# Patient Record
Sex: Female | Born: 1980 | Race: White | Hispanic: No | Smoking: Current every day smoker
Health system: Southern US, Community
[De-identification: ages and names within clinical notes are randomized; demographics above are authoritative.]

## PROBLEM LIST (undated history)

## (undated) DIAGNOSIS — N201 Calculus of ureter: Secondary | ICD-10-CM

## (undated) DIAGNOSIS — R7303 Prediabetes: Secondary | ICD-10-CM

## (undated) DIAGNOSIS — E785 Hyperlipidemia, unspecified: Secondary | ICD-10-CM

## (undated) DIAGNOSIS — F17210 Nicotine dependence, cigarettes, uncomplicated: Secondary | ICD-10-CM

## (undated) DIAGNOSIS — I10 Essential (primary) hypertension: Secondary | ICD-10-CM

---

## 2016-01-31 HISTORY — PX: BREAST BIOPSY: SHX20

## 2020-04-20 ENCOUNTER — Ambulatory Visit
Admission: RE | Admit: 2020-04-20 | Discharge: 2020-04-20 | Disposition: A | Discharge status: Home / Self Care | Payer: Self-pay | Source: Ambulatory Visit | Attending: Oncology | Admitting: Oncology

## 2020-04-20 ENCOUNTER — Ambulatory Visit: Payer: Medicaid Other | Attending: Oncology

## 2020-04-20 ENCOUNTER — Other Ambulatory Visit: Payer: Self-pay

## 2020-04-20 VITALS — BP 130/79 | HR 98 | Temp 96.7°F | Ht 59.5 in | Wt 202.0 lb

## 2020-04-20 DIAGNOSIS — Z0000 Encounter for general adult medical examination without abnormal findings: Secondary | ICD-10-CM | POA: Insufficient documentation

## 2020-04-20 DIAGNOSIS — Z9189 Other specified personal risk factors, not elsewhere classified: Secondary | ICD-10-CM

## 2020-04-20 NOTE — Progress Notes (Signed)
  Subjective:     Patient ID: Aram Candela, female   DOB: November 26, 1980, 40 y.o.   MRN: [id number]  HPI   Review of Systems     Objective:   Physical Exam Chest:  Breasts:     Right: No swelling, bleeding, inverted nipple, mass, nipple discharge, skin change or tenderness.     Left: No swelling, bleeding, inverted nipple, mass, nipple discharge, skin change or tenderness.          Assessment:     40 year old patient presents for [facility] clinic visit. History of right breast biopsy. States it was a needle biopsy.  Patient has signed for release of outside films from [place].   Patient screened, and meets BCCCP eligibility.  Patient does not have insurance, Medicare or Medicaid. Instructed patient on breast self awareness using teach back method.  Clinical breast exam unremarkable. No mass or lump palpated.  Risk Assessment    Risk Scores      04/20/2020   Last edited by: Jim Like, RN   5-year risk: 2 %   Lifetime risk: 25.5 %            Plan:     Sent for bilateral screening mammogram.  Referred to High Risk clinic based on Tyrer Cuzick risk score.

## 2020-04-27 ENCOUNTER — Inpatient Hospital Stay: Payer: Medicaid Other

## 2020-04-27 ENCOUNTER — Encounter: Payer: Self-pay | Admitting: Internal Medicine

## 2020-04-27 ENCOUNTER — Inpatient Hospital Stay: Payer: Self-pay | Attending: Internal Medicine | Admitting: Internal Medicine

## 2020-04-27 VITALS — BP 117/89 | HR 103 | Temp 98.1°F | Resp 16 | Ht 59.5 in | Wt 202.0 lb

## 2020-04-27 DIAGNOSIS — Z1501 Genetic susceptibility to malignant neoplasm of breast: Secondary | ICD-10-CM

## 2020-04-27 DIAGNOSIS — Z9189 Other specified personal risk factors, not elsewhere classified: Secondary | ICD-10-CM | POA: Insufficient documentation

## 2020-04-27 DIAGNOSIS — F17210 Nicotine dependence, cigarettes, uncomplicated: Secondary | ICD-10-CM

## 2020-04-27 DIAGNOSIS — Z803 Family history of malignant neoplasm of breast: Secondary | ICD-10-CM

## 2020-04-27 NOTE — Assessment & Plan Note (Addendum)
#   HIGH RISK of BREAST CANCER-   #Had a long discussion with the patient regarding incorporation of healthy lifestyle #moderation of alcohol #abstinence of smoking #maintaining healthy BMI#exercise 150-300 minutes of moderate intensity exercise/week  #Discussed the role of tamoxifen in risk reduction of development of breast cancer.  I reviewed the data of using tamoxifen 20 mg a day for 5 years.  Studies have shown that tamoxifen reduces the risk of development of breast cancer by approximately 50%. Long discussion regarding the potential adverse events on tamoxifen including but not limited to hot flashes, mood swings, thromboembolic events strokes and also small risk of uterine cancers.    #After the lengthy discussion patient states that she she needed time to decide.  She will let us know of her decision.  # cancer screening: a] colonoscopy at 44 years b] annual PAP smear-2022 -WNL.  # Smoking: Discussed with the patient regarding the ill effects of smoking- including but not limited to cardiac lung and vascular diseases and malignancies. Counseled against smoking.   # GENETICS: Given the patient's family history of breast cancer; patient is concerned I think is reasonable to proceed with genetic counseling evaluation.   Thank you, Ms Regino Schultze allowing me to participate in the care of your pleasant patient. Please do not hesitate to contact me with questions or concerns in the interim.   # DISPOSITION: # referral to genetics Counsellor re: family hx of breast cancer # follow up TBD-Dr.B

## 2020-04-27 NOTE — Progress Notes (Signed)
[facility] CONSULT NOTE  Patient Care Team: [facility] as PCP - General (General Practice) Jim Like, RN as Registered Nurse Scarlett Presto, RN as Registered Nurse  CHIEF COMPLAINTS/PURPOSE OF CONSULTATION: High risk of breast cancer  # IBS RISK -of breast cancer at 2025% lifetime risk; 2% over the next 5 years  #Active smoker; Hx mother with breast cancer-thyroid-uterine cancer Oncology History   No history exists.     HISTORY OF PRESENTING ILLNESS:  Regina Lowe 40 y.o.  female was recently evaluated at the breast clinic/and was noted to have elevated risk of breast cancer; referred to [place] for further evaluation recommendations.  Patient states to have breast biopsy-in 2018 in [place].  She denies any malignancy. 4  Mammogram February 2022-within normal limits.   Patient's risk calculated-anywhere between-20 to 25% lifetime risk; with 2% risk over the next 5 years.  Review of Systems  Constitutional: Negative for chills, diaphoresis, fever, malaise/fatigue and weight loss.  HENT: Negative for nosebleeds and sore throat.   Eyes: Negative for double vision.  Respiratory: Negative for cough, hemoptysis, sputum production, shortness of breath and wheezing.   Cardiovascular: Negative for chest pain, palpitations, orthopnea and leg swelling.  Gastrointestinal: Negative for abdominal pain, blood in stool, constipation, diarrhea, heartburn, melena, nausea and vomiting.  Genitourinary: Negative for dysuria, frequency and urgency.  Musculoskeletal: Negative for back pain and joint pain.  Skin: Negative.  Negative for itching and rash.  Neurological: Negative for dizziness, tingling, focal weakness, weakness and headaches.  Endo/Heme/Allergies: Does not bruise/bleed easily.  Psychiatric/Behavioral: Negative for depression. The patient is not nervous/anxious and does not have insomnia.      MEDICAL HISTORY:  No past medical history on  file.  SURGICAL HISTORY: Past Surgical History:  Procedure Laterality Date  . BREAST BIOPSY Right 2018    SOCIAL HISTORY: Social History   Socioeconomic History  . Marital status: Unknown    Spouse name: Not on file  . Number of children: Not on file  . Years of education: Not on file  . Highest education level: Not on file  Occupational History  . Not on file  Tobacco Use  . Smoking status: Not on file  . Smokeless tobacco: Not on file  Substance and Sexual Activity  . Alcohol use: Not on file  . Drug use: Not on file  . Sexual activity: Not on file  Other Topics Concern  . Not on file  Social History Narrative   Moved from [place] in 2018; lives in [place] with 3 kids [sons x 2; 1 daughter]; work from [employer]; smoker ~1ppd; no alcohol.    Social Determinants of Health   Financial Resource Strain: Not on file  Food Insecurity: Not on file  Transportation Needs: Not on file  Physical Activity: Not on file  Stress: Not on file  Social Connections: Not on file  Intimate Partner Violence: Not on file    FAMILY HISTORY: Family History  Problem Relation Age of Onset  . Breast cancer Mother 54       breast, thyroid, uterine; skin cancer[place]    ALLERGIES:  has No Known Allergies.  MEDICATIONS:  Current Outpatient Medications  Medication Sig Dispense Refill  . Cholecalciferol (VITAMIN D3 PO) Take by mouth.    . Multiple Vitamins-Minerals (ONE-A-DAY WOMENS VITACRAVES PO) Take by mouth.    . Zinc Sulfate (ZINC 15 PO) Take by mouth.     No current  facility-administered medications for this visit.      Marland Kitchen  PHYSICAL EXAMINATION: ECOG PERFORMANCE STATUS: 0 - Asymptomatic  Vitals:   04/27/20 1120  BP: 117/89  Pulse: (!) 103  Resp: 16  Temp: 98.1 F (36.7 C)  SpO2: 98%   Filed Weights   04/27/20 1120  Weight: 202 lb (91.6 kg)    Physical Exam HENT:     Head: Normocephalic and atraumatic.     Mouth/Throat:      Pharynx: No oropharyngeal exudate.  Eyes:     Pupils: Pupils are equal, round, and reactive to light.  Cardiovascular:     Rate and Rhythm: Normal rate and regular rhythm.  Pulmonary:     Effort: Pulmonary effort is normal. No respiratory distress.     Breath sounds: Normal breath sounds. No wheezing.  Abdominal:     General: Bowel sounds are normal. There is no distension.     Palpations: Abdomen is soft. There is no mass.     Tenderness: There is no abdominal tenderness. There is no guarding or rebound.  Musculoskeletal:        General: No tenderness. Normal range of motion.     Cervical back: Normal range of motion and neck supple.  Skin:    General: Skin is warm.  Neurological:     Mental Status: She is alert and oriented to person, place, and time.  Psychiatric:        Mood and Affect: Affect normal.      LABORATORY DATA:  I have reviewed the data as listed No results found for: WBC, HGB, HCT, MCV, PLT No results for input(s): NA, K, CL, CO2, GLUCOSE, BUN, CREATININE, CALCIUM, GFRNONAA, GFRAA, PROT, ALBUMIN, AST, ALT, ALKPHOS, BILITOT, BILIDIR, IBILI in the last 8760 hours.  RADIOGRAPHIC STUDIES: I have personally reviewed the radiological images as listed and agreed with the findings in the report. MS DIGITAL SCREENING TOMO BILATERAL  Result Date: 04/21/2020 CLINICAL DATA:  Screening. EXAM: DIGITAL SCREENING BILATERAL MAMMOGRAM WITH TOMOSYNTHESIS AND CAD TECHNIQUE: Bilateral screening digital craniocaudal and mediolateral oblique mammograms were obtained. Bilateral screening digital breast tomosynthesis was performed. The images were evaluated with computer-aided detection. COMPARISON:  Previous exam(s). ACR Breast Density Category b: There are scattered areas of fibroglandular density. FINDINGS: There are no findings suspicious for malignancy. The images were evaluated with computer-aided detection. IMPRESSION: No mammographic evidence of malignancy. A result letter of this  screening mammogram will be mailed directly to the patient. RECOMMENDATION: Screening mammogram in one year. (Code:SM-B-01Y) BI-RADS CATEGORY  1: Negative. Electronically Signed   By: Emmaline Kluver M.D.   On: 04/21/2020 13:09    ASSESSMENT & PLAN:   At high risk for breast cancer # HIGH RISK of BREAST CANCER-   #Had a long discussion with the patient regarding incorporation of healthy lifestyle #moderation of alcohol #abstinence of smoking #maintaining healthy BMI#exercise 150-300 minutes of moderate intensity exercise/week  #Discussed the role of tamoxifen in risk reduction of development of breast cancer.  I reviewed the data of using tamoxifen 20 mg a day for 5 years.  Studies have shown that tamoxifen reduces the risk of development of breast cancer by approximately 50%. Long discussion regarding the potential adverse events on tamoxifen including but not limited to hot flashes, mood swings, thromboembolic events strokes and also small risk of uterine cancers.    #After the lengthy discussion patient states that she she needed time to decide.  She will let us know of her decision.  #  cancer screening: a] colonoscopy at 44 years b] annual PAP smear-2022 -WNL.  # Smoking: Discussed with the patient regarding the ill effects of smoking- including but not limited to cardiac lung and vascular diseases and malignancies. Counseled against smoking.   # GENETICS: Given the patient's family history of breast cancer; patient is concerned I think is reasonable to proceed with genetic counseling evaluation.   Thank you, Ms Regino Schultze allowing me to participate in the care of your pleasant patient. Please do not hesitate to contact me with questions or concerns in the interim.   # DISPOSITION: # referral to genetics Counsellor re: family hx of breast cancer # follow up TBD-Dr.B  All questions were answered. The patient knows to call the clinic with any problems, questions or concerns.     Earna Coder, MD 04/27/2020 4:20 PM

## 2020-04-28 NOTE — Progress Notes (Addendum)
Letter mailed from [facility] to notify of normal mammogram results.  Patient to return in one year for annual screening.  Seen by Dr. Donneta Romberg for high risk clinic.   Copy to [facility].

## 2020-05-13 ENCOUNTER — Other Ambulatory Visit: Payer: Medicaid Other

## 2020-05-13 ENCOUNTER — Encounter: Payer: Medicaid Other | Admitting: Licensed Clinical Social Worker

## 2020-05-27 ENCOUNTER — Inpatient Hospital Stay: Payer: Medicaid Other | Admitting: Licensed Clinical Social Worker

## 2020-05-27 ENCOUNTER — Inpatient Hospital Stay: Payer: Medicaid Other

## 2020-06-03 ENCOUNTER — Inpatient Hospital Stay: Payer: Medicaid Other | Admitting: Licensed Clinical Social Worker

## 2020-06-03 ENCOUNTER — Inpatient Hospital Stay: Payer: Medicaid Other

## 2020-06-16 DIAGNOSIS — E785 Hyperlipidemia, unspecified: Secondary | ICD-10-CM | POA: Diagnosis not present

## 2020-06-16 DIAGNOSIS — E669 Obesity, unspecified: Secondary | ICD-10-CM | POA: Diagnosis not present

## 2020-06-16 DIAGNOSIS — R0789 Other chest pain: Secondary | ICD-10-CM | POA: Diagnosis not present

## 2020-12-27 DIAGNOSIS — M542 Cervicalgia: Secondary | ICD-10-CM | POA: Diagnosis not present

## 2021-04-08 DIAGNOSIS — F330 Major depressive disorder, recurrent, mild: Secondary | ICD-10-CM | POA: Diagnosis not present

## 2021-04-08 DIAGNOSIS — R000 Tachycardia, unspecified: Secondary | ICD-10-CM | POA: Diagnosis not present

## 2021-04-08 DIAGNOSIS — Z139 Encounter for screening, unspecified: Secondary | ICD-10-CM | POA: Diagnosis not present

## 2021-04-08 DIAGNOSIS — Z1389 Encounter for screening for other disorder: Secondary | ICD-10-CM | POA: Diagnosis not present

## 2021-04-08 DIAGNOSIS — E781 Pure hyperglyceridemia: Secondary | ICD-10-CM | POA: Diagnosis not present

## 2021-04-08 DIAGNOSIS — Z0000 Encounter for general adult medical examination without abnormal findings: Secondary | ICD-10-CM | POA: Diagnosis not present

## 2021-04-08 DIAGNOSIS — R7309 Other abnormal glucose: Secondary | ICD-10-CM | POA: Diagnosis not present

## 2021-04-20 DIAGNOSIS — Z1389 Encounter for screening for other disorder: Secondary | ICD-10-CM | POA: Diagnosis not present

## 2021-04-20 DIAGNOSIS — R7309 Other abnormal glucose: Secondary | ICD-10-CM | POA: Diagnosis not present

## 2021-05-05 DIAGNOSIS — F330 Major depressive disorder, recurrent, mild: Secondary | ICD-10-CM | POA: Diagnosis not present

## 2021-06-02 ENCOUNTER — Other Ambulatory Visit: Payer: Self-pay | Admitting: Family Medicine

## 2021-06-02 DIAGNOSIS — Z1231 Encounter for screening mammogram for malignant neoplasm of breast: Secondary | ICD-10-CM

## 2021-06-07 DIAGNOSIS — R7309 Other abnormal glucose: Secondary | ICD-10-CM | POA: Diagnosis not present

## 2021-06-07 DIAGNOSIS — Z1389 Encounter for screening for other disorder: Secondary | ICD-10-CM | POA: Diagnosis not present

## 2021-06-28 DIAGNOSIS — F330 Major depressive disorder, recurrent, mild: Secondary | ICD-10-CM | POA: Diagnosis not present

## 2021-07-01 ENCOUNTER — Ambulatory Visit
Admission: RE | Admit: 2021-07-01 | Discharge: 2021-07-01 | Disposition: A | Discharge status: Home / Self Care | Payer: Medicaid Other | Source: Ambulatory Visit | Attending: Family Medicine | Admitting: Family Medicine

## 2021-07-01 DIAGNOSIS — Z1231 Encounter for screening mammogram for malignant neoplasm of breast: Secondary | ICD-10-CM | POA: Insufficient documentation

## 2021-07-26 DIAGNOSIS — R7309 Other abnormal glucose: Secondary | ICD-10-CM | POA: Diagnosis not present

## 2021-07-26 DIAGNOSIS — Z1389 Encounter for screening for other disorder: Secondary | ICD-10-CM | POA: Diagnosis not present

## 2021-08-09 DIAGNOSIS — E781 Pure hyperglyceridemia: Secondary | ICD-10-CM | POA: Diagnosis not present

## 2021-08-09 DIAGNOSIS — R079 Chest pain, unspecified: Secondary | ICD-10-CM | POA: Diagnosis not present

## 2021-08-09 DIAGNOSIS — L989 Disorder of the skin and subcutaneous tissue, unspecified: Secondary | ICD-10-CM | POA: Diagnosis not present

## 2021-08-09 DIAGNOSIS — Z1389 Encounter for screening for other disorder: Secondary | ICD-10-CM | POA: Diagnosis not present

## 2021-09-06 DIAGNOSIS — Z1389 Encounter for screening for other disorder: Secondary | ICD-10-CM | POA: Diagnosis not present

## 2021-09-06 DIAGNOSIS — R7309 Other abnormal glucose: Secondary | ICD-10-CM | POA: Diagnosis not present

## 2021-10-27 DIAGNOSIS — G44209 Tension-type headache, unspecified, not intractable: Secondary | ICD-10-CM | POA: Diagnosis not present

## 2021-10-27 DIAGNOSIS — Z1389 Encounter for screening for other disorder: Secondary | ICD-10-CM | POA: Diagnosis not present

## 2021-10-27 DIAGNOSIS — E781 Pure hyperglyceridemia: Secondary | ICD-10-CM | POA: Diagnosis not present

## 2021-10-27 DIAGNOSIS — M79605 Pain in left leg: Secondary | ICD-10-CM | POA: Diagnosis not present

## 2021-10-27 DIAGNOSIS — F330 Major depressive disorder, recurrent, mild: Secondary | ICD-10-CM | POA: Diagnosis not present

## 2021-10-31 ENCOUNTER — Other Ambulatory Visit: Payer: Self-pay | Admitting: Family Medicine

## 2021-10-31 DIAGNOSIS — M79605 Pain in left leg: Secondary | ICD-10-CM

## 2021-11-10 ENCOUNTER — Ambulatory Visit
Admission: RE | Admit: 2021-11-10 | Discharge: 2021-11-10 | Disposition: A | Discharge status: Home / Self Care | Payer: Medicaid Other | Source: Ambulatory Visit | Attending: Family Medicine | Admitting: Family Medicine

## 2021-11-10 DIAGNOSIS — M79662 Pain in left lower leg: Secondary | ICD-10-CM | POA: Diagnosis not present

## 2021-11-10 DIAGNOSIS — M79605 Pain in left leg: Secondary | ICD-10-CM | POA: Insufficient documentation

## 2021-11-29 DIAGNOSIS — Z1389 Encounter for screening for other disorder: Secondary | ICD-10-CM | POA: Diagnosis not present

## 2021-11-29 DIAGNOSIS — Z112 Encounter for screening for other bacterial diseases: Secondary | ICD-10-CM | POA: Diagnosis not present

## 2021-11-29 DIAGNOSIS — J020 Streptococcal pharyngitis: Secondary | ICD-10-CM | POA: Diagnosis not present

## 2022-01-30 DIAGNOSIS — Z419 Encounter for procedure for purposes other than remedying health state, unspecified: Secondary | ICD-10-CM | POA: Diagnosis not present

## 2022-03-02 DIAGNOSIS — Z419 Encounter for procedure for purposes other than remedying health state, unspecified: Secondary | ICD-10-CM | POA: Diagnosis not present

## 2022-03-08 DIAGNOSIS — H5213 Myopia, bilateral: Secondary | ICD-10-CM | POA: Diagnosis not present

## 2022-03-31 DIAGNOSIS — Z419 Encounter for procedure for purposes other than remedying health state, unspecified: Secondary | ICD-10-CM | POA: Diagnosis not present

## 2022-05-01 DIAGNOSIS — Z419 Encounter for procedure for purposes other than remedying health state, unspecified: Secondary | ICD-10-CM | POA: Diagnosis not present

## 2022-05-10 DIAGNOSIS — F4321 Adjustment disorder with depressed mood: Secondary | ICD-10-CM | POA: Diagnosis not present

## 2022-05-24 DIAGNOSIS — F4321 Adjustment disorder with depressed mood: Secondary | ICD-10-CM | POA: Diagnosis not present

## 2022-05-31 DIAGNOSIS — Z419 Encounter for procedure for purposes other than remedying health state, unspecified: Secondary | ICD-10-CM | POA: Diagnosis not present

## 2022-06-21 DIAGNOSIS — F4321 Adjustment disorder with depressed mood: Secondary | ICD-10-CM | POA: Diagnosis not present

## 2022-07-01 DIAGNOSIS — Z419 Encounter for procedure for purposes other than remedying health state, unspecified: Secondary | ICD-10-CM | POA: Diagnosis not present

## 2022-07-05 DIAGNOSIS — F4321 Adjustment disorder with depressed mood: Secondary | ICD-10-CM | POA: Diagnosis not present

## 2022-07-19 DIAGNOSIS — F4321 Adjustment disorder with depressed mood: Secondary | ICD-10-CM | POA: Diagnosis not present

## 2022-07-31 DIAGNOSIS — Z419 Encounter for procedure for purposes other than remedying health state, unspecified: Secondary | ICD-10-CM | POA: Diagnosis not present

## 2022-08-16 DIAGNOSIS — F4321 Adjustment disorder with depressed mood: Secondary | ICD-10-CM | POA: Diagnosis not present

## 2022-08-31 DIAGNOSIS — Z419 Encounter for procedure for purposes other than remedying health state, unspecified: Secondary | ICD-10-CM | POA: Diagnosis not present

## 2022-09-13 DIAGNOSIS — F4321 Adjustment disorder with depressed mood: Secondary | ICD-10-CM | POA: Diagnosis not present

## 2022-09-26 DIAGNOSIS — Z1389 Encounter for screening for other disorder: Secondary | ICD-10-CM | POA: Diagnosis not present

## 2022-09-26 DIAGNOSIS — R7309 Other abnormal glucose: Secondary | ICD-10-CM | POA: Diagnosis not present

## 2022-09-26 DIAGNOSIS — E781 Pure hyperglyceridemia: Secondary | ICD-10-CM | POA: Diagnosis not present

## 2022-09-26 DIAGNOSIS — M25521 Pain in right elbow: Secondary | ICD-10-CM | POA: Diagnosis not present

## 2022-09-26 DIAGNOSIS — Z1331 Encounter for screening for depression: Secondary | ICD-10-CM | POA: Diagnosis not present

## 2022-09-29 ENCOUNTER — Other Ambulatory Visit: Payer: Self-pay | Admitting: Family Medicine

## 2022-09-29 DIAGNOSIS — Z1231 Encounter for screening mammogram for malignant neoplasm of breast: Secondary | ICD-10-CM

## 2022-10-10 ENCOUNTER — Ambulatory Visit
Admission: RE | Admit: 2022-10-10 | Discharge: 2022-10-10 | Disposition: A | Discharge status: Home / Self Care | Payer: Medicaid Other | Source: Ambulatory Visit | Attending: Family Medicine | Admitting: Family Medicine

## 2022-10-10 DIAGNOSIS — Z1231 Encounter for screening mammogram for malignant neoplasm of breast: Secondary | ICD-10-CM | POA: Insufficient documentation

## 2022-11-25 ENCOUNTER — Inpatient Hospital Stay
Admission: RE | Admit: 2022-11-25 | Discharge: 2022-11-25 | Discharge status: Home / Self Care | Payer: Medicaid Other | Source: Ambulatory Visit

## 2022-11-25 VITALS — BP 125/89 | HR 102 | Temp 99.4°F | Resp 18

## 2022-11-25 DIAGNOSIS — H9201 Otalgia, right ear: Secondary | ICD-10-CM

## 2022-11-25 DIAGNOSIS — K0889 Other specified disorders of teeth and supporting structures: Secondary | ICD-10-CM | POA: Diagnosis not present

## 2022-11-25 MED ORDER — AMOXICILLIN-POT CLAVULANATE 875-125 MG PO TABS: 1.0000 | ORAL_TABLET | Freq: Two times a day (BID) | ORAL | 0 refills | Status: DC

## 2022-11-25 NOTE — Discharge Instructions (Addendum)
Exam there are no overt signs of infection to your ears throat, very mild erythema within the ear canal however as you endorsed discomfort within the teeth will move forward with treatment as such  Take augmentin twice daily for 7 days, you should begin to see improvement after 48 hours of medication use and then it should progressively get better  May attempt use of salt water gargles , throat lozenges, warm liquids for additional comfort  You may use Tylenol or ibuprofen for management of discomfort  May hold warm compresses to the ear for additional comfort  Please not attempted any ear cleaning or object or fluid placement into the ear canal to prevent further irritation  Lease follow-up with dentist for further evaluation and management of your teeth, local office listed on front page

## 2022-11-25 NOTE — ED Triage Notes (Signed)
Pain in right ear that goes down her neck and throat, that started 2 days ago. Taking ibuprofen with little relief.

## 2022-11-25 NOTE — ED Provider Notes (Signed)
Regina Lowe    CSN: [id number] Arrival date & time: 11/25/22  1312      History   Chief Complaint Chief Complaint  Patient presents with   Sore Throat    Right ear pain, neck pain - Entered by patient    HPI Regina Lowe is a 42 y.o. female.   Patient presents for evaluation of right-sided ear pain radiating to the right side of the neck present for 2 days.  Its pain can be felt when turning head to the left and the right.  Has been experiencing mild T sensitivity, exacerbated by cold.  Is in need of dental work.  Has been able to tolerate food and liquids.  No known sick contacts prior.  Denies presence of fever, congestion, cough.   History reviewed. No pertinent past medical history.  Patient Active Problem List   Diagnosis Date Noted   At high risk for breast cancer 04/27/2020    Past Surgical History:  Procedure Laterality Date   BREAST BIOPSY Right 2018    OB History   No obstetric history on file.      Home Medications    Prior to Admission medications   Medication Sig Start Date End Date Taking? Authorizing Provider  amoxicillin-clavulanate (AUGMENTIN) 875-125 MG tablet Take 1 tablet by mouth every 12 (twelve) hours. 11/25/22  Yes Rinoa Garramone R, NP  meloxicam (MOBIC) 15 MG tablet Take 1 tablet every day by oral route. 11/15/22  Yes [provider]  Cholecalciferol (VITAMIN D3 PO) Take by mouth.    [provider]  losartan (COZAAR) 25 MG tablet     [provider]  Multiple Vitamins-Minerals (ONE-A-DAY WOMENS VITACRAVES PO) Take by mouth.    [provider]  WEGOVY 0.25 MG/0.5ML SOAJ     [provider]  Zinc Sulfate (ZINC 15 PO) Take by mouth.    [provider]    Family History Family History  Problem Relation Age of Onset   Breast cancer Mother 84       breast, thyroid, uterine; skin cancer[place]   Breast cancer Maternal Aunt     Social History Social  History   Tobacco Use   Smoking status: Every Day    Current packs/day: 0.50    Average packs/day: 0.5 packs/day for 34.8 years (17.4 ttl pk-yrs)    Types: Cigarettes    Start date: 1990   Smokeless tobacco: Never  Substance Use Topics   Alcohol use: Never   Drug use: Yes    Types: Marijuana     Allergies   Patient has no known allergies.   Review of Systems Review of Systems   Physical Exam Triage Vital Signs ED Triage Vitals  Encounter Vitals Group     BP 11/25/22 1323 125/89     Systolic BP Percentile --      Diastolic BP Percentile --      Pulse Rate 11/25/22 1323 (!) 102     Resp 11/25/22 1323 18     Temp 11/25/22 1323 99.4 F (37.4 C)     Temp Source 11/25/22 1323 Oral     SpO2 11/25/22 1323 95 %     Weight --      Height --      Head Circumference --      Peak Flow --      Pain Score 11/25/22 1324 5     Pain Loc --      Pain  Education --      Exclude from [employer] Chart --    No data found.  Updated Vital Signs BP 125/89 (BP Location: Left Arm)   Pulse (!) 102   Temp 99.4 F (37.4 C) (Oral)   Resp 18   LMP 11/10/2022 (Approximate)   SpO2 95%   Visual Acuity Right Eye Distance:   Left Eye Distance:   Bilateral Distance:    Right Eye Near:   Left Eye Near:    Bilateral Near:     Physical Exam Constitutional:      Appearance: She is well-developed.  HENT:     Head: Normocephalic.     Left Ear: Tympanic membrane, ear canal and external ear normal.     Ears:     Comments: Scant erythema to the right ear canal, no swelling, drainage, no abnormality to the tympanic membrane or external ear    Nose: No congestion or rhinorrhea.     Mouth/Throat:     Pharynx: No oropharyngeal exudate or posterior oropharyngeal erythema.     Comments:  No significant dental decay or gingival swelling, no dental abscess noted Musculoskeletal:     Cervical back: Normal range of motion.  Lymphadenopathy:     Cervical: Cervical adenopathy present.   Neurological:     Mental Status: She is alert.      UC Treatments / Results  Labs (all labs ordered are listed, but only abnormal results are displayed) Labs Reviewed - No data to display  EKG   Radiology No results found.  Procedures Procedures (including critical care time)  Medications Ordered in UC Medications - No data to display  Initial Impression / Assessment and Plan / UC Course  I have reviewed the triage vital signs and the nursing notes.  Pertinent labs & imaging results that were available during my care of the patient were reviewed by me and considered in my medical decision making (see chart for details).  Otalgia right ear, dental pain  No overt signs of infection to the ear or throat, discussed with patient, deferring strep testing at this time, denies presence of fever, believes symptoms are coming from her teeth will move forward with prophylactic treatment, Augmentin prescribed, recommended supportive care and advised follow-up if symptoms continue to persist or worsening, given walker referral to local dentist office for further management Final Clinical Impressions(s) / UC Diagnoses   Final diagnoses:  Otalgia, right ear  Pain, dental     Discharge Instructions      Exam there are no overt signs of infection to your ears throat, very mild erythema within the ear canal however as you endorsed discomfort within the teeth will move forward with treatment as such  Take augmentin twice daily for 7 days, you should begin to see improvement after 48 hours of medication use and then it should progressively get better  May attempt use of salt water gargles , throat lozenges, warm liquids for additional comfort  You may use Tylenol or ibuprofen for management of discomfort  May hold warm compresses to the ear for additional comfort  Please not attempted any ear cleaning or object or fluid placement into the ear canal to prevent further  irritation  Lease follow-up with dentist for further evaluation and management of your teeth, local office listed on front page    ED Prescriptions     Medication Sig Dispense Auth. Provider   amoxicillin-clavulanate (AUGMENTIN) 875-125 MG tablet Take 1 tablet by mouth every 12 (twelve) hours.  14 tablet Arriyah Madej, Elita Boone, NP      PDMP not reviewed this encounter.   Valinda Hoar, NP 11/25/22 1352

## 2023-10-29 ENCOUNTER — Emergency Department (HOSPITAL_BASED_OUTPATIENT_CLINIC_OR_DEPARTMENT_OTHER)

## 2023-10-29 ENCOUNTER — Emergency Department (HOSPITAL_BASED_OUTPATIENT_CLINIC_OR_DEPARTMENT_OTHER): Admitting: Radiology

## 2023-10-29 ENCOUNTER — Emergency Department (HOSPITAL_BASED_OUTPATIENT_CLINIC_OR_DEPARTMENT_OTHER): Admission: EM | Admit: 2023-10-29 | Discharge: 2023-10-30 | Disposition: A

## 2023-10-29 ENCOUNTER — Encounter (HOSPITAL_BASED_OUTPATIENT_CLINIC_OR_DEPARTMENT_OTHER): Payer: Self-pay | Admitting: Emergency Medicine

## 2023-10-29 ENCOUNTER — Other Ambulatory Visit: Payer: Self-pay

## 2023-10-29 DIAGNOSIS — R0602 Shortness of breath: Secondary | ICD-10-CM | POA: Insufficient documentation

## 2023-10-29 DIAGNOSIS — N12 Tubulo-interstitial nephritis, not specified as acute or chronic: Secondary | ICD-10-CM | POA: Insufficient documentation

## 2023-10-29 DIAGNOSIS — R079 Chest pain, unspecified: Secondary | ICD-10-CM | POA: Diagnosis not present

## 2023-10-29 DIAGNOSIS — R112 Nausea with vomiting, unspecified: Secondary | ICD-10-CM | POA: Diagnosis present

## 2023-10-29 LAB — COMPREHENSIVE METABOLIC PANEL WITH GFR
ALT: 9 U/L (ref 0–44)
AST: 15 U/L (ref 15–41)
Albumin: 4.1 g/dL (ref 3.5–5.0)
Alkaline Phosphatase: 78 U/L (ref 38–126)
Anion gap: 17 — ABNORMAL HIGH (ref 5–15)
BUN: 11 mg/dL (ref 6–20)
CO2: 20 mmol/L — ABNORMAL LOW (ref 22–32)
Calcium: 9.5 mg/dL (ref 8.9–10.3)
Chloride: 100 mmol/L (ref 98–111)
Creatinine, Ser: 0.76 mg/dL (ref 0.44–1.00)
GFR, Estimated: >60 mL/min (ref >60)
Glucose, Bld: 115 mg/dL — ABNORMAL HIGH (ref 70–99)
Potassium: 2.9 mmol/L — ABNORMAL LOW (ref 3.5–5.1)
Sodium: 137 mmol/L (ref 135–145)
Total Bilirubin: 0.6 mg/dL (ref 0.0–1.2)
Total Protein: 7 g/dL (ref 6.5–8.1)

## 2023-10-29 LAB — CBC
HCT: 40.7 % (ref 36.0–46.0)
Hemoglobin: 14 g/dL (ref 12.0–15.0)
MCH: 29.5 pg (ref 26.0–34.0)
MCHC: 34.4 g/dL (ref 30.0–36.0)
MCV: 85.7 fL (ref 80.0–100.0)
Platelets: 325 K/uL (ref 150–400)
RBC: 4.75 MIL/uL (ref 3.87–5.11)
RDW: 13.8 % (ref 11.5–15.5)
WBC: 18 K/uL — ABNORMAL HIGH (ref 4.0–10.5)
nRBC: 0 % (ref 0.0–0.2)

## 2023-10-29 LAB — URINALYSIS, MICROSCOPIC (REFLEX): RBC / HPF: >50 RBC/hpf (ref 0–5)

## 2023-10-29 LAB — URINALYSIS, ROUTINE W REFLEX MICROSCOPIC

## 2023-10-29 LAB — TROPONIN T, HIGH SENSITIVITY: Troponin T High Sensitivity: <15 ng/L (ref 0–19)

## 2023-10-29 LAB — HCG, SERUM, QUALITATIVE: Preg, Serum: NEGATIVE

## 2023-10-29 LAB — LIPASE, BLOOD: Lipase: 18 U/L (ref 11–51)

## 2023-10-29 MED ORDER — SODIUM CHLORIDE 0.9 % IV BOLUS
1000.0000 mL | Freq: Once | INTRAVENOUS | Status: AC
Start: 2023-10-29 — End: 2023-10-29
  Administered 2023-10-29: 1000 mL via INTRAVENOUS

## 2023-10-29 MED ORDER — CEFTRIAXONE SODIUM 1 G IJ SOLR
1.0000 g | Freq: Once | INTRAMUSCULAR | Status: AC
  Administered 2023-10-29: 1 g via INTRAVENOUS
  Filled 2023-10-29: qty 10

## 2023-10-29 MED ORDER — HYDROMORPHONE HCL 1 MG/ML IJ SOLN
0.5000 mg | Freq: Once | INTRAMUSCULAR | Status: AC
  Administered 2023-10-29: 0.5 mg via INTRAVENOUS
  Filled 2023-10-29: qty 1

## 2023-10-29 MED ORDER — POTASSIUM CHLORIDE CRYS ER 20 MEQ PO TBCR
40.0000 meq | EXTENDED_RELEASE_TABLET | Freq: Once | ORAL | Status: AC
  Administered 2023-10-29: 40 meq via ORAL
  Filled 2023-10-29: qty 2

## 2023-10-29 MED ORDER — ONDANSETRON HCL 4 MG/2ML IJ SOLN
4.0000 mg | Freq: Once | INTRAMUSCULAR | Status: AC
  Administered 2023-10-29: 4 mg via INTRAVENOUS
  Filled 2023-10-29: qty 2

## 2023-10-29 MED ORDER — MORPHINE SULFATE (PF) 2 MG/ML IV SOLN
2.0000 mg | Freq: Once | INTRAVENOUS | Status: AC
  Administered 2023-10-29: 2 mg via INTRAVENOUS
  Filled 2023-10-29: qty 1

## 2023-10-29 MED ORDER — ONDANSETRON 4 MG PO TBDP: 4.0000 mg | ORAL_TABLET | Freq: Once | ORAL | Status: DC | PRN

## 2023-10-29 MED ORDER — KETOROLAC TROMETHAMINE 15 MG/ML IJ SOLN
15.0000 mg | Freq: Once | INTRAMUSCULAR | Status: AC
  Administered 2023-10-29: 15 mg via INTRAVENOUS
  Filled 2023-10-29: qty 1

## 2023-10-29 NOTE — ED Notes (Signed)
 Pt back in room from xray

## 2023-10-29 NOTE — ED Triage Notes (Signed)
 Pt c/o emesis since Saturday which had stopped yesterday, but started back up again today. Endorses chest pain and shob. Takes wegovy

## 2023-10-29 NOTE — ED Notes (Signed)
 Ambulated with nurse to restroom to produce urine specimen.

## 2023-10-30 MED ORDER — HYDROCODONE-ACETAMINOPHEN 5-325 MG PO TABS: 1.0000 | ORAL_TABLET | ORAL | 0 refills | Status: AC | PRN

## 2023-10-30 MED ORDER — PROMETHAZINE HCL 25 MG RE SUPP: 25.0000 mg | Freq: Four times a day (QID) | RECTAL | 0 refills | Status: DC | PRN

## 2023-10-30 MED ORDER — ONDANSETRON 4 MG PO TBDP: 4.0000 mg | ORAL_TABLET | Freq: Three times a day (TID) | ORAL | 0 refills | Status: DC | PRN

## 2023-10-30 MED ORDER — CIPROFLOXACIN HCL 500 MG PO TABS: 500.0000 mg | ORAL_TABLET | Freq: Two times a day (BID) | ORAL | 0 refills | Status: DC

## 2023-10-30 NOTE — ED Provider Notes (Signed)
 [place] [facility]ERGENCY DEPARTMENT AT [facility] Provider Note   CSN: [id number] Arrival date & time: 10/29/23  1933     Patient presents with: Emesis and Chest Pain   Regina Lowe is a 43 y.o. female.   This is a 43 year old female presenting emergency department with concern for uncontrolled nausea vomiting.  Reports symptoms started on Saturday somewhat started yesterday, but then returned again.  Left-sided flank pain rating to abdomen.  Also reporting some chest pain and shortness of breath.  No prior surgeries.  Denies fevers and chills   Emesis Chest Pain Associated symptoms: vomiting        Prior to Admission medications   Medication Sig Start Date End Date Taking? Authorizing Provider  amoxicillin -clavulanate (AUGMENTIN ) 875-125 MG tablet Take 1 tablet by mouth every 12 (twelve) hours. 11/25/22   Teresa Shelba SAUNDERS, NP  Cholecalciferol (VITAMIN D3 PO) Take by mouth.    [provider]  losartan (COZAAR) 25 MG tablet     [provider]  meloxicam (MOBIC) 15 MG tablet Take 1 tablet every day by oral route. 11/15/22   [provider]  Multiple Vitamins-Minerals (ONE-A-DAY WOMENS VITACRAVES PO) Take by mouth.    [provider]  WEGOVY 0.25 MG/0.5ML SOAJ     [provider]  Zinc Sulfate (ZINC 15 PO) Take by mouth.    [provider]    Allergies: Patient has no known allergies.    Review of Systems  Cardiovascular:  Positive for chest pain.  Gastrointestinal:  Positive for vomiting.    Updated Vital Signs BP (!) 134/94   Pulse 77   Temp 97.7 F (36.5 C)   Resp 20   Ht 4' 11.5 (1.511 m)   Wt 70.8 kg   SpO2 91%   BMI 30.98 kg/m   Physical Exam Vitals and nursing note reviewed.  Constitutional:      Comments: Appears to be in severe pain.  HENT:     Head: Normocephalic and atraumatic.  Eyes:     Conjunctiva/sclera: Conjunctivae normal.  Cardiovascular:     Rate and Rhythm: Normal rate and  regular rhythm.     Heart sounds: Normal heart sounds.  Pulmonary:     Effort: Pulmonary effort is normal.     Breath sounds: Normal breath sounds.  Abdominal:     General: Abdomen is flat. There is no distension.     Tenderness: There is no abdominal tenderness. There is left CVA tenderness. There is no guarding or rebound.  Skin:    General: Skin is warm.     Capillary Refill: Capillary refill takes less than 2 seconds.  Neurological:     Mental Status: She is alert and oriented to person, place, and time.  Psychiatric:        Mood and Affect: Mood normal.        Behavior: Behavior normal.     (all labs ordered are listed, but only abnormal results are displayed) Labs Reviewed  COMPREHENSIVE METABOLIC PANEL WITH GFR - Abnormal; Notable for the following components:      Result Value   Potassium 2.9 (*)    CO2 20 (*)    Glucose, Bld 115 (*)    Anion gap 17 (*)    All other components within normal limits  CBC - Abnormal; Notable for the following components:   WBC 18.0 (*)    All other components within normal limits  URINALYSIS, ROUTINE W REFLEX MICROSCOPIC - Abnormal; Notable for the  following components:   Color, Urine RED (*)    APPearance HAZY (*)    Glucose, UA   (*)    Value: TEST NOT REPORTED DUE TO COLOR INTERFERENCE OF URINE PIGMENT   Hgb urine dipstick   (*)    Value: TEST NOT REPORTED DUE TO COLOR INTERFERENCE OF URINE PIGMENT   Bilirubin Urine   (*)    Value: TEST NOT REPORTED DUE TO COLOR INTERFERENCE OF URINE PIGMENT   Ketones, ur   (*)    Value: TEST NOT REPORTED DUE TO COLOR INTERFERENCE OF URINE PIGMENT   Protein, ur   (*)    Value: TEST NOT REPORTED DUE TO COLOR INTERFERENCE OF URINE PIGMENT   Nitrite   (*)    Value: TEST NOT REPORTED DUE TO COLOR INTERFERENCE OF URINE PIGMENT   Leukocytes,Ua   (*)    Value: TEST NOT REPORTED DUE TO COLOR INTERFERENCE OF URINE PIGMENT   All other components within normal limits  URINALYSIS, MICROSCOPIC (REFLEX) -  Abnormal; Notable for the following components:   Bacteria, UA RARE (*)    All other components within normal limits  LIPASE, BLOOD  HCG, SERUM, QUALITATIVE  TROPONIN T, HIGH SENSITIVITY    EKG: EKG Interpretation Date/Time:  Monday October 29 2023 19:50:47 EDT Ventricular Rate:  79 PR Interval:  146 QRS Duration:  91 QT Interval:  392 QTC Calculation: 450 R Axis:   84  Text Interpretation: Sinus rhythm Low voltage, precordial leads Confirmed by Neysa Clap [phone]) on 10/29/2023 8:26:29 PM  Radiology: CT Renal Stone Study Result Date: 10/29/2023 CLINICAL DATA:  Emesis flank pain EXAM: CT ABDOMEN AND PELVIS WITHOUT CONTRAST TECHNIQUE: Multidetector CT imaging of the abdomen and pelvis was performed following the standard protocol without IV contrast. RADIATION DOSE REDUCTION: This exam was performed according to the departmental dose-optimization program which includes automated exposure control, adjustment of the mA and/or kV according to patient size and/or use of iterative reconstruction technique. COMPARISON:  None Available. FINDINGS: Lower chest: Lung bases demonstrate no acute airspace disease Hepatobiliary: No focal liver abnormality is seen. No gallstones, gallbladder wall thickening, or biliary dilatation. Pancreas: Unremarkable. No pancreatic ductal dilatation or surrounding inflammatory changes. Spleen: Normal in size without focal abnormality. Adrenals/Urinary Tract: Adrenal glands are normal. Mild left hydronephrosis. No hydroureter. No obstructing stone. Multiple left-sided kidney stones measuring up to 8 mm in aggregate. Question mild fat stranding at the left renal pelvis, series 2, image 31. Bladder is unremarkable Stomach/Bowel: Stomach within normal limits. No dilated small bowel. No acute bowel wall thickening. Negative appendix Vascular/Lymphatic: Aortic atherosclerosis. No enlarged abdominal or pelvic lymph nodes. Reproductive: Uterus and bilateral adnexa are  unremarkable. Other: Negative for pelvic effusion or free air. Small fat containing umbilical hernia and supraumbilical ventral hernia. Musculoskeletal: No acute osseous abnormality IMPRESSION: 1. Mild left hydronephrosis without hydroureter or obstructing stone, question mild UPJ configuration or recently passed stone. Possible mild fat stranding at the left renal pelvis, findings could be secondary to superimposed upper UTI. 2. Multiple left-sided kidney stones measuring up to 8 mm in aggregate. 3. Aortic atherosclerosis. Aortic Atherosclerosis (ICD10-I70.0). Electronically Signed   By: Luke Bun M.D.   On: 10/29/2023 21:35   DG Chest 2 View Result Date: 10/29/2023 CLINICAL DATA:  Vomiting. EXAM: CHEST - 2 VIEW COMPARISON:  None Available. FINDINGS: The heart size and mediastinal contours are within normal limits. Both lungs are clear. The visualized skeletal structures are unremarkable. IMPRESSION: No active cardiopulmonary disease. Electronically Signed  By: Suzen Dials M.D.   On: 10/29/2023 20:15     Procedures   Medications Ordered in the ED  ondansetron [facility]) injection 4 mg (4 mg Intravenous Given 10/29/23 2052)  potassium chloride SA (KLOR-CON M) CR tablet 40 mEq (40 mEq Oral Given 10/29/23 2052)  sodium chloride 0.9 % bolus 1,000 mL (0 mLs Intravenous Stopped 10/29/23 2216)  ketorolac (TORADOL) 15 MG/ML injection 15 mg (15 mg Intravenous Given 10/29/23 2103)  morphine (PF) 2 MG/ML injection 2 mg (2 mg Intravenous Given 10/29/23 2103)  HYDROmorphone (DILAUDID) injection 0.5 mg (0.5 mg Intravenous Given 10/29/23 2239)  cefTRIAXone (ROCEPHIN) 1 g in sodium chloride 0.9 % 100 mL IVPB (1 g Intravenous New Bag/Given 10/29/23 2340)                                    Medical Decision Making This is a 43 year old female presenting emergency department with left flank pain, nausea vomiting.  Is afebrile nontachycardic, slightly hypertensive.  Maintaining oxygen saturation on room air.   Appears to be quite uncomfortable.  Given IV narcotics for pain control, as well as Zofran.  Troponins negative.  ACS unlikely.  Hypokalemia, but normal kidney function.  No transaminitis to suggest hepatobiliary disease.  Lipase normal.  Does have a leukocytosis, but no fever or tachycardia, UA with frank hematuria.  CT scan with some stranding to the left kidney with some minor hydronephrosis possible recently passed stone versus pyelonephritis.  Given Rocephin.  Will need antibiotics.  Amount and/or Complexity of Data Reviewed Independent Historian:     Details: Family member notes that she has had uncontrolled vomiting since Saturday External Data Reviewed:     Details: No prior imaging Labs: ordered. Radiology: ordered and independent interpretation performed.    Details: Do not appreciate free air ECG/medicine tests:     Details: No ischemic changes  Risk Prescription drug management. Parenteral controlled substances. Decision regarding hospitalization. Diagnosis or treatment significantly limited by social determinants of health. Risk Details: Poor health literacy       Final diagnoses:  None    ED Discharge Orders     None          Neysa Caron PARAS, DO 10/30/23 0017

## 2023-10-30 NOTE — ED Provider Notes (Signed)
 Signed out to me to monitor.  Upon recheck she reports significant improvement of her pain.  She has been ambulatory and tolerating oral intake.  Discharged with outpatient management for pyelonephritis.   Haze Lonni PARAS, MD 10/30/23 [id number]

## 2023-11-06 ENCOUNTER — Other Ambulatory Visit: Payer: Self-pay

## 2023-11-06 ENCOUNTER — Other Ambulatory Visit (HOSPITAL_BASED_OUTPATIENT_CLINIC_OR_DEPARTMENT_OTHER): Payer: Self-pay

## 2023-11-06 ENCOUNTER — Emergency Department (HOSPITAL_BASED_OUTPATIENT_CLINIC_OR_DEPARTMENT_OTHER)
Admission: EM | Admit: 2023-11-06 | Discharge: 2023-11-06 | Disposition: A | Discharge status: Home / Self Care | Attending: Emergency Medicine | Admitting: Emergency Medicine

## 2023-11-06 ENCOUNTER — Emergency Department (HOSPITAL_BASED_OUTPATIENT_CLINIC_OR_DEPARTMENT_OTHER)

## 2023-11-06 DIAGNOSIS — R10A2 Flank pain, left side: Secondary | ICD-10-CM | POA: Diagnosis present

## 2023-11-06 DIAGNOSIS — N200 Calculus of kidney: Secondary | ICD-10-CM

## 2023-11-06 DIAGNOSIS — N132 Hydronephrosis with renal and ureteral calculous obstruction: Secondary | ICD-10-CM | POA: Insufficient documentation

## 2023-11-06 LAB — CBC WITH DIFFERENTIAL/PLATELET
Abs Immature Granulocytes: 0.04 K/uL (ref 0.00–0.07)
Basophils Absolute: 0.1 K/uL (ref 0.0–0.1)
Basophils Relative: 0 %
Eosinophils Absolute: 0.1 K/uL (ref 0.0–0.5)
Eosinophils Relative: 1 %
HCT: 41.7 % (ref 36.0–46.0)
Hemoglobin: 14.1 g/dL (ref 12.0–15.0)
Immature Granulocytes: 0 %
Lymphocytes Relative: 20 %
Lymphs Abs: 2.5 K/uL (ref 0.7–4.0)
MCH: 29.5 pg (ref 26.0–34.0)
MCHC: 33.8 g/dL (ref 30.0–36.0)
MCV: 87.2 fL (ref 80.0–100.0)
Monocytes Absolute: 0.6 K/uL (ref 0.1–1.0)
Monocytes Relative: 5 %
Neutro Abs: 9 K/uL — ABNORMAL HIGH (ref 1.7–7.7)
Neutrophils Relative %: 74 %
Platelets: 327 K/uL (ref 150–400)
RBC: 4.78 MIL/uL (ref 3.87–5.11)
RDW: 14.1 % (ref 11.5–15.5)
WBC: 12.4 K/uL — ABNORMAL HIGH (ref 4.0–10.5)
nRBC: 0 % (ref 0.0–0.2)

## 2023-11-06 LAB — URINALYSIS, ROUTINE W REFLEX MICROSCOPIC
Bacteria, UA: NONE SEEN
Bilirubin Urine: NEGATIVE
Glucose, UA: NEGATIVE mg/dL
Ketones, ur: 15 mg/dL — AB
Leukocytes,Ua: NEGATIVE
Nitrite: NEGATIVE
Specific Gravity, Urine: 1.017 (ref 1.005–1.030)
pH: 7.5 (ref 5.0–8.0)

## 2023-11-06 LAB — COMPREHENSIVE METABOLIC PANEL WITH GFR
ALT: 15 U/L (ref 0–44)
AST: 23 U/L (ref 15–41)
Albumin: 4 g/dL (ref 3.5–5.0)
Alkaline Phosphatase: 77 U/L (ref 38–126)
Anion gap: 16 — ABNORMAL HIGH (ref 5–15)
BUN: <5 mg/dL — ABNORMAL LOW (ref 6–20)
CO2: 19 mmol/L — ABNORMAL LOW (ref 22–32)
Calcium: 9.6 mg/dL (ref 8.9–10.3)
Chloride: 103 mmol/L (ref 98–111)
Creatinine, Ser: 0.75 mg/dL (ref 0.44–1.00)
GFR, Estimated: >60 mL/min (ref >60)
Glucose, Bld: 98 mg/dL (ref 70–99)
Potassium: 4 mmol/L (ref 3.5–5.1)
Sodium: 138 mmol/L (ref 135–145)
Total Bilirubin: 0.6 mg/dL (ref 0.0–1.2)
Total Protein: 7.2 g/dL (ref 6.5–8.1)

## 2023-11-06 LAB — PREGNANCY, URINE: Preg Test, Ur: NEGATIVE

## 2023-11-06 LAB — LIPASE, BLOOD: Lipase: 17 U/L (ref 11–51)

## 2023-11-06 MED ORDER — ONDANSETRON HCL 4 MG/2ML IJ SOLN
4.0000 mg | Freq: Once | INTRAMUSCULAR | Status: AC
  Administered 2023-11-06: 4 mg via INTRAVENOUS
  Filled 2023-11-06: qty 2

## 2023-11-06 MED ORDER — OXYCODONE HCL 5 MG PO TABS
5.0000 mg | ORAL_TABLET | ORAL | 0 refills | Status: DC | PRN
  Filled 2023-11-06: qty 20, 4d supply, fill #0

## 2023-11-06 MED ORDER — SODIUM CHLORIDE 0.9 % IV BOLUS
1000.0000 mL | Freq: Once | INTRAVENOUS | Status: AC
  Administered 2023-11-06: 1000 mL via INTRAVENOUS

## 2023-11-06 MED ORDER — TAMSULOSIN HCL 0.4 MG PO CAPS
0.4000 mg | ORAL_CAPSULE | Freq: Every day | ORAL | 0 refills | Status: AC
Start: 2023-11-06 — End: 2023-11-13
  Filled 2023-11-06: qty 7, 7d supply, fill #0

## 2023-11-06 MED ORDER — KETOROLAC TROMETHAMINE 15 MG/ML IJ SOLN
15.0000 mg | Freq: Once | INTRAMUSCULAR | Status: AC
  Administered 2023-11-06: 15 mg via INTRAVENOUS
  Filled 2023-11-06: qty 1

## 2023-11-06 MED ORDER — HYDROMORPHONE HCL 1 MG/ML IJ SOLN
1.0000 mg | Freq: Once | INTRAMUSCULAR | Status: AC
  Administered 2023-11-06: 1 mg via INTRAVENOUS
  Filled 2023-11-06: qty 1

## 2023-11-06 NOTE — Discharge Instructions (Addendum)
 Recommend 1000 mg of Tylenol every 6 hours as needed for pain.  Recommend Zofran as needed for nausea and vomiting.  I have written you for narcotic pain medicine called oxycodone for breakthrough pain.  I have written you for Flomax to help with movement of the stone as well.  Please return if you develop fever greater than 100.4 or uncontrollable nausea and vomiting.  Keep your appointment with urology in the morning.

## 2023-11-06 NOTE — ED Triage Notes (Signed)
 Pt POV with c/o of continuing abd pain from a 8mm Kidney stone. Finished round of antibiotics. N/V starting today. Pain worse today. Decreased frequency and burning with urination starting yesterday.

## 2023-11-06 NOTE — ED Provider Notes (Signed)
  EMERGENCY DEPARTMENT AT [facility] Provider Note   CSN: [id number] Arrival date & time: 11/06/23  9184     Patient presents with: Abdominal Pain   Regina Lowe is a 43 y.o. female.   Left flank pain intensified this morning with history of kidney stones feels similar.  She has been having some intermittent pain but pain much worse today.  Denies any fever or chills.  Denies any painful urination.  Denies any weakness numbness tingling.  She has had nausea and vomiting.  Denies any headache.  The history is provided by the patient.       Prior to Admission medications   Medication Sig Start Date End Date Taking? Authorizing Provider  oxyCODONE (ROXICODONE) 5 MG immediate release tablet Take 1 tablet (5 mg total) by mouth every 4 (four) hours as needed for up to 20 doses for breakthrough pain. 11/06/23  Yes Danyon Mcginness, DO  tamsulosin (FLOMAX) 0.4 MG CAPS capsule Take 1 capsule (0.4 mg total) by mouth daily for 7 days. 11/06/23 11/13/23 Yes Elaynah Virginia, DO  WEGOVY 1 MG/0.5ML SOAJ SQ injection Inject 1 mg into the skin once a week. 10/15/23  Yes [provider]  amoxicillin -clavulanate (AUGMENTIN ) 875-125 MG tablet Take 1 tablet by mouth every 12 (twelve) hours. 11/25/22   Teresa Shelba SAUNDERS, NP  Cholecalciferol (VITAMIN D3 PO) Take by mouth.    [provider]  ciprofloxacin (CIPRO) 500 MG tablet Take 1 tablet (500 mg total) by mouth every 12 (twelve) hours. 10/30/23   Neysa Caron PARAS, DO  HYDROcodone-acetaminophen (NORCO/VICODIN) 5-325 MG tablet Take 1 tablet by mouth every 4 (four) hours as needed for moderate pain (pain score 4-6). 10/30/23   Haze Lonni PARAS, MD  losartan (COZAAR) 25 MG tablet     [provider]  meloxicam (MOBIC) 15 MG tablet Take 1 tablet every day by oral route. 11/15/22   [provider]  Multiple Vitamins-Minerals (ONE-A-DAY WOMENS VITACRAVES PO) Take by mouth.    [provider]   ondansetron (ZOFRAN-ODT) 4 MG disintegrating tablet Take 1 tablet (4 mg total) by mouth every 8 (eight) hours as needed for nausea or vomiting. 10/30/23   Neysa Caron PARAS, DO  promethazine (PHENERGAN) 25 MG suppository Place 1 suppository (25 mg total) rectally every 6 (six) hours as needed for nausea or vomiting. 10/30/23   Neysa Caron PARAS, DO  WEGOVY 0.25 MG/0.5ML SOAJ     [provider]  Zinc Sulfate (ZINC 15 PO) Take by mouth.    [provider]    Allergies: Patient has no known allergies.    Review of Systems  Updated Vital Signs BP (!) 147/108 (BP Location: Right Arm)   Pulse (!) 102   Temp 99 F (37.2 C) (Oral)   Resp 20   Ht 4' 11 (1.499 m)   Wt 71.7 kg   LMP 10/29/2023   SpO2 100%   BMI 31.91 kg/m   Physical Exam Vitals and nursing note reviewed.  Constitutional:      General: She is in acute distress.     Appearance: She is well-developed.  HENT:     Head: Normocephalic and atraumatic.  Eyes:     Conjunctiva/sclera: Conjunctivae normal.  Cardiovascular:     Rate and Rhythm: Normal rate and regular rhythm.     Heart sounds: Normal heart sounds. No murmur heard. Pulmonary:     Effort: Pulmonary effort is normal. No respiratory distress.     Breath sounds: Normal breath  sounds.  Abdominal:     Palpations: Abdomen is soft.     Tenderness: There is no abdominal tenderness. There is left CVA tenderness.  Musculoskeletal:        General: No swelling.     Cervical back: Neck supple.  Skin:    General: Skin is warm and dry.     Capillary Refill: Capillary refill takes less than 2 seconds.  Neurological:     Mental Status: She is alert.  Psychiatric:        Mood and Affect: Mood normal.     (all labs ordered are listed, but only abnormal results are displayed) Labs Reviewed  CBC WITH DIFFERENTIAL/PLATELET - Abnormal; Notable for the following components:      Result Value   WBC 12.4 (*)    Neutro Abs 9.0 (*)    All other components  within normal limits  COMPREHENSIVE METABOLIC PANEL WITH GFR - Abnormal; Notable for the following components:   CO2 19 (*)    BUN <5 (*)    Anion gap 16 (*)    All other components within normal limits  URINALYSIS, ROUTINE W REFLEX MICROSCOPIC - Abnormal; Notable for the following components:   Hgb urine dipstick SMALL (*)    Ketones, ur 15 (*)    Protein, ur TRACE (*)    All other components within normal limits  LIPASE, BLOOD  PREGNANCY, URINE  HCG, SERUM, QUALITATIVE    EKG: None  Radiology: CT Renal Stone Study Result Date: 11/06/2023 EXAM: CT UROGRAM 11/06/2023 09:02:00 AM TECHNIQUE: CT of the abdomen and pelvis was performed without the administration of intravenous contrast as per CT urogram protocol. Multiplanar reformatted images as well as MIP urogram images are provided for review. Automated exposure control, iterative reconstruction, and/or weight based adjustment of the mA/kV was utilized to reduce the radiation dose to as low as reasonably achievable. COMPARISON: CT abdomen and pelvis 10/29/2023. CLINICAL HISTORY: 43 year old female. Abdominal/flank pain, stone suspected. Continuing abd pain from 8mm kidney stone. N/V. Decreased frequency and burning with urination. FINDINGS: LOWER CHEST: No acute abnormality. LIVER: The liver is unremarkable. GALLBLADDER AND BILE DUCTS: Gallbladder is unremarkable. No biliary ductal dilatation. SPLEEN: No acute abnormality. PANCREAS: No acute abnormality. ADRENAL GLANDS: No acute abnormality. KIDNEYS, URETERS AND BLADDER: Right kidney remains nonobstructed with minimal punctate right nephrolithiasis. Ongoing left hydronephrosis and left nephrolithiasis. One of the previous intrarenal calculi is now at the proximal left ureteropelvic junction (UPJ) measuring 7 mm long axis on coronal image 60. Beyond that level, the left ureter remains asymmetrically dilated, such as on series 2 image 49. The distal left ureter in the pelvis is dilated (coronal  image 36), and there is a lobulated 7 mm calculus at the left ureterovesical junction (UVJ) (coronal image 43). There is mild to moderate regional inflammation about the left UVJ. No perinephric or periureteral stranding. Bladder is decompressed. Incidental pelvic phleboliths are superimposed. GI AND BOWEL: Stomach demonstrates no acute abnormality. Mild diverticulosis is suspected in the transverse colon. No active inflammation. Normal gas containing appendix on series 2 image 60. There is no bowel obstruction. PERITONEUM AND RETROPERITONEUM: No ascites. No free air. VASCULATURE: Age advanced calcified atherosclerosis of the abdominal aorta and proximal iliac arteries. Aorta is normal in caliber. LYMPH NODES: No lymphadenopathy. REPRODUCTIVE ORGANS: No acute abnormality. BONES AND SOFT TISSUES: A small fat containing umbilical hernia is stable. No herniated bowel. No acute osseous abnormality. No focal soft tissue abnormality. IMPRESSION: 1. Obstructive Uropathy on the Left  now due to 7 mm stones located at both the left UPJ AND the UVJ, with mild to moderate regional inflammation at the latter. 2. Minimal right nephrolithiasis. Electronically signed by: Helayne Hurst MD 11/06/2023 09:19 AM EDT RP Workstation: [id number]     Procedures   Medications Ordered in the ED  HYDROmorphone (DILAUDID) injection 1 mg (1 mg Intravenous Given 11/06/23 0833)  ondansetron (ZOFRAN) injection 4 mg (4 mg Intravenous Given 11/06/23 0833)  sodium chloride 0.9 % bolus 1,000 mL (0 mLs Intravenous Stopped 11/06/23 0948)  ketorolac (TORADOL) 15 MG/ML injection 15 mg (15 mg Intravenous Given 11/06/23 0947)                                    Medical Decision Making Amount and/or Complexity of Data Reviewed Labs: ordered. Radiology: ordered.  Risk Prescription drug management.   Lamanda Marriott is here with left flank pain.  History of kidney stones.  Differential diagnosis likely kidney stone versus pyelonephritis versus  less likely other intra-abdominal process like diverticulitis.  Vital signs unremarkable.  She looks uncomfortable looks consistent with a with a stone.  However we will get CT scan basic labs IV Dilaudid IV Zofran IV fluids and reevaluate.  She denies any fever.  Lab work overall reassuring.  White count is 12.4 but no fever.  Urinalysis negative for infection.  No significant anemia or electrolyte abnormality or kidney injury.  CT scan shows obstructive uropathy on the left with two 7 mm stones located at the left UPJ and another 1 at the UVJ.  Pain has greatly improved with IV medicines here.  Will prescribe her oxycodone, Flomax.  She has Zofran at home.  Check she has an appointment with urology at 8 AM tomorrow.  She understands return if she develops worsening pain nausea vomiting fever or other concerning symptoms.  Patient discharged.  This chart was dictated using voice recognition software.  Despite best efforts to proofread,  errors can occur which can change the documentation meaning.      Final diagnoses:  Kidney stone    ED Discharge Orders          Ordered    oxyCODONE (ROXICODONE) 5 MG immediate release tablet  Every 4 hours PRN        11/06/23 0950    tamsulosin (FLOMAX) 0.4 MG CAPS capsule  Daily        11/06/23 0950               Ruthe Cornet, DO 11/06/23 [phone]

## 2023-11-07 ENCOUNTER — Encounter: Payer: Self-pay | Admitting: Urology

## 2023-11-07 ENCOUNTER — Ambulatory Visit
Admission: RE | Admit: 2023-11-07 | Discharge: 2023-11-07 | Disposition: A | Source: Ambulatory Visit | Attending: Urology | Admitting: Urology

## 2023-11-07 ENCOUNTER — Ambulatory Visit
Admission: RE | Admit: 2023-11-07 | Discharge: 2023-11-07 | Disposition: A | Discharge status: Home / Self Care | Source: Ambulatory Visit | Attending: Urology | Admitting: Urology

## 2023-11-07 ENCOUNTER — Ambulatory Visit (INDEPENDENT_AMBULATORY_CARE_PROVIDER_SITE_OTHER): Admitting: Urology

## 2023-11-07 VITALS — BP 139/89 | HR 116 | Ht 59.0 in | Wt 159.0 lb

## 2023-11-07 DIAGNOSIS — N201 Calculus of ureter: Secondary | ICD-10-CM

## 2023-11-07 DIAGNOSIS — N200 Calculus of kidney: Secondary | ICD-10-CM

## 2023-11-07 DIAGNOSIS — N132 Hydronephrosis with renal and ureteral calculous obstruction: Secondary | ICD-10-CM

## 2023-11-07 DIAGNOSIS — N23 Unspecified renal colic: Secondary | ICD-10-CM

## 2023-11-07 DIAGNOSIS — N202 Calculus of kidney with calculus of ureter: Secondary | ICD-10-CM | POA: Diagnosis not present

## 2023-11-07 NOTE — Progress Notes (Signed)
 11/07/2023 8:40 AM   Taylia Mayhall October 13, 1980 [id number]  Referring provider: Center, [facility] [address] [place],  [place] [address]  Chief Complaint  Patient presents with   Nephrolithiasis    HPI: Anira Casaus is a 43 y.o. female seen in follow-up of recent ED visit for renal colic.  Presented to [facility] 10/29/2022 with complaints of nausea, vomiting and left flank pain radiating to left lower quadrant UA grossly bloody with microscopy showing >50 RBC CT renal stone study showed mild left hydronephrosis with multiple left renal calculi which were felt to be nonobstructing. She was felt to have pyelonephritis although urine culture was not ordered.  She was given Rocephin and discharged on Cipro Return to [facility] ED yesterday with worsening pain.  UA with microhematuria and not consistent with infection.  Follow-up CT stone study showed 2 calculi measuring 7 mm at the left UPJ and a 7 mm left UVJ calculus.  Mild hydronephrosis noted.  She received parenteral analgesics and after good pain control was discharged on tamsulosin and oxycodone Presently asymptomatic today.  Symptoms have included urinary frequency, urgency and left flank pain radiating to left lower quadrant Prior history of stone disease 15-16 years ago which did not require intervention On Wegovy with last injection 11/04/2023   PMH: No past medical history on file.  Surgical History: Past Surgical History:  Procedure Laterality Date   BREAST BIOPSY Right 2018   CESAREAN SECTION      Home Medications:  Allergies as of 11/07/2023   No Known Allergies      Medication List        Accurate as of November 07, 2023  8:40 AM. If you have any questions, ask your nurse or doctor.          amoxicillin -clavulanate 875-125 MG tablet Commonly known as: AUGMENTIN  Take 1 tablet by mouth every 12 (twelve) hours.   ciprofloxacin 500 MG tablet Commonly known as: CIPRO Take  1 tablet (500 mg total) by mouth every 12 (twelve) hours.   HYDROcodone-acetaminophen 5-325 MG tablet Commonly known as: NORCO/VICODIN Take 1 tablet by mouth every 4 (four) hours as needed for moderate pain (pain score 4-6).   losartan 25 MG tablet Commonly known as: COZAAR   meloxicam 15 MG tablet Commonly known as: MOBIC Take 1 tablet every day by oral route.   ondansetron 4 MG disintegrating tablet Commonly known as: ZOFRAN-ODT Take 1 tablet (4 mg total) by mouth every 8 (eight) hours as needed for nausea or vomiting.   ONE-A-DAY WOMENS VITACRAVES PO Take by mouth.   oxyCODONE 5 MG immediate release tablet Commonly known as: Roxicodone Take 1 tablet (5 mg total) by mouth every 4 (four) hours as needed for up to 20 doses for breakthrough pain.   promethazine 25 MG suppository Commonly known as: PHENERGAN Place 1 suppository (25 mg total) rectally every 6 (six) hours as needed for nausea or vomiting.   tamsulosin 0.4 MG Caps capsule Commonly known as: FLOMAX Take 1 capsule (0.4 mg total) by mouth daily for 7 days.   VITAMIN D3 PO Take by mouth.   Wegovy 0.25 MG/0.5ML Soaj SQ injection Generic drug: semaglutide-weight management   Wegovy 1 MG/0.5ML Soaj SQ injection Generic drug: semaglutide-weight management Inject 1 mg into the skin once a week.   ZINC 15 PO Take by mouth.        Allergies: No Known Allergies  Family History: Family History  Problem Relation Age of Onset  Breast cancer Mother 51       breast, thyroid , uterine; skin cancer[place]   Breast cancer Maternal Aunt     Social History:  reports that she has been smoking cigarettes. She started smoking about 35 years ago. She has a 17.9 pack-year smoking history. She has never used smokeless tobacco. She reports current drug use. Drug: Marijuana. She reports that she does not drink alcohol.   Physical Exam: BP 139/89   Pulse (!) 116   Ht 4' 11 (1.499 m)   Wt 159 lb  (72.1 kg)   LMP 10/29/2023   BMI 32.11 kg/m   Constitutional:  Alert, No acute distress. HEENT: [place] AT Respiratory: Normal respiratory effort, no increased work of breathing.  Psychiatric: Normal mood and affect.   Pertinent Imaging: CT images were personally reviewed and interpreted.  There is marked dilation of the ureter all the way to the calculus or calculi at the UVJ.  The upper ureteral stones are present in the dilated ureter and will most likely migrate to the UVJ based on the degree of dilation   CT Renal Stone Study  Narrative EXAM: CT UROGRAM 11/06/2023 09:02:00 AM  TECHNIQUE: CT of the abdomen and pelvis was performed without the administration of intravenous contrast as per CT urogram protocol. Multiplanar reformatted images as well as MIP urogram images are provided for review. Automated exposure control, iterative reconstruction, and/or weight based adjustment of the mA/kV was utilized to reduce the radiation dose to as low as reasonably achievable.  COMPARISON: CT abdomen and pelvis 10/29/2023.  CLINICAL HISTORY: 43 year old female. Abdominal/flank pain, stone suspected. Continuing abd pain from 8mm kidney stone. N/V. Decreased frequency and burning with urination.  FINDINGS:  LOWER CHEST: No acute abnormality.  LIVER: The liver is unremarkable.  GALLBLADDER AND BILE DUCTS: Gallbladder is unremarkable. No biliary ductal dilatation.  SPLEEN: No acute abnormality.  PANCREAS: No acute abnormality.  ADRENAL GLANDS: No acute abnormality.  KIDNEYS, URETERS AND BLADDER: Right kidney remains nonobstructed with minimal punctate right nephrolithiasis. Ongoing left hydronephrosis and left nephrolithiasis. One of the previous intrarenal calculi is now at the proximal left ureteropelvic junction (UPJ) measuring 7 mm long axis on coronal image 60. Beyond that level, the left ureter remains asymmetrically dilated, such as on series 2 image 49. The  distal left ureter in the pelvis is dilated (coronal image 36), and there is a lobulated 7 mm calculus at the left ureterovesical junction (UVJ) (coronal image 43). There is mild to moderate regional inflammation about the left UVJ. No perinephric or periureteral stranding. Bladder is decompressed. Incidental pelvic phleboliths are superimposed.  GI AND BOWEL: Stomach demonstrates no acute abnormality. Mild diverticulosis is suspected in the transverse colon. No active inflammation. Normal gas containing appendix on series 2 image 60. There is no bowel obstruction.  PERITONEUM AND RETROPERITONEUM: No ascites. No free air.  VASCULATURE: Age advanced calcified atherosclerosis of the abdominal aorta and proximal iliac arteries. Aorta is normal in caliber.  LYMPH NODES: No lymphadenopathy.  REPRODUCTIVE ORGANS: No acute abnormality.  BONES AND SOFT TISSUES: A small fat containing umbilical hernia is stable. No herniated bowel. No acute osseous abnormality. No focal soft tissue abnormality.  IMPRESSION: 1. Obstructive Uropathy on the Left now due to 7 mm stones located at both the left UPJ AND the UVJ, with mild to moderate regional inflammation at the latter. 2. Minimal right nephrolithiasis.  Electronically signed by: Helayne Hurst MD 11/06/2023 09:19 AM EDT RP Workstation: [id number]  Assessment & Plan:    1.  Left ureteral calculi She has probably 2 left UVJ calculi with marked upstream hydroureter The upper ureteral calculi are not obstructing and if she does not pass the distal stone(s) should migrate distally We discussed various treatment options for urolithiasis including observation with or without medical expulsive therapy, shockwave lithotripsy (SWL), ureteroscopy and laser lithotripsy with stent placement We discussed that management is based on stone size, location, density, patient co-morbidities, and patient preference.  Stones <50mm in size have a >80%  spontaneous passage rate. Data surrounding the use of tamsulosin for medical expulsive therapy is controversial, but meta analyses suggests it is most efficacious for distal stones between 5-19mm in size.  SWL has a lower stone free rate in a single procedure, but also a lower complication rate compared to ureteroscopy and avoids a stent and associated stent related symptoms. Possible complications include renal hematoma, steinstrasse, and need for additional treatment. Ureteroscopy with laser lithotripsy and stent placement has a higher stone free rate than SWL in a single procedure, however increased complication rate including possible infection, ureteral injury, bleeding, and stent related morbidity. Common stent related symptoms include dysuria, urgency/frequency, and flank pain. After an extensive discussion of the risks and benefits of the above treatment options, the patient would like to proceed with medical expulsion therapy. KUB ordered today  2.  Bilateral nephrolithiasis Small, nonobstructing left renal calculi Punctate right lower calculi Recommend metabolic evaluation after current stone episode resolved   Glendia JAYSON Barba, MD  [facility] [address] [place], [place] [address] [phone]

## 2023-11-08 ENCOUNTER — Other Ambulatory Visit: Payer: Self-pay | Admitting: *Deleted

## 2023-11-08 DIAGNOSIS — N201 Calculus of ureter: Secondary | ICD-10-CM

## 2023-11-16 ENCOUNTER — Ambulatory Visit: Payer: Self-pay | Admitting: Urology

## 2023-11-19 NOTE — Telephone Encounter (Signed)
Left message for patient to return call and sent a MyChart message.

## 2023-11-20 NOTE — H&P (View-Only) (Signed)
 11/22/2023 11:37 AM   Regina Lowe 31-Dec-1980 [id number]  Referring provider: Center, [facility] [address] [place],  [place] [address]  Urological history: 1. Nephrolithiasis -  CT (09//2025) Mild left hydronephrosis without hydroureter or obstructing stone, question mild UPJ configuration or recently passed stone. Possible mild fat stranding at the left renal pelvis, findings could be secondary to superimposed upper UTI.  Multiple left-sided kidney stones measuring up to 8 mm in aggregate. - CT (897974)  Obstructive Uropathy on the Left now due to 7 mm stones located at both the left UPJ AND the UVJ, with mild to moderate regional inflammation at the latter.  Minimal right nephrolithiasis. - KUB (10/2023) 7 mm calcification to the left of L3 corresponds to ureteral stone on prior exam. Two adjacent stones in the region of the left ureterovesicular junction are unchanged.  Chief Complaint  Patient presents with   Follow-up   HPI: Regina Lowe is a 43 y.o. woman who presents today for KUB to see the progression of her left ureteral stones after MET.   Previous records reviewed.  She has not passed any fragments.  She continues to have left-sided flank pain, urinary frequency and lower abdominal pain.  Patient denies any modifying or aggravating factors.  Patient denies any recent UTI's, gross hematuria, dysuria or suprapubic pain.  Patient denies any fevers, chills, nausea or vomiting.    KUB the left UVJ stones are no longer visible, but the left 7 mm proximal stone is still present ureteral stone is still present  UA orange cloudy, specific amity 1.025, pH 6.0, 2+ protein, 3+ heme, trace leukocyte, 6-10 WBCs, greater than 30 RBCs, 0-10 epithelial cells, amorphous sediment present, mucus at present and a few bacteria.  Serum creatinine (10/2023) 0.75, eGFR >60  PMH: No past medical history on file.  Surgical History: Past Surgical History:  Procedure  Laterality Date   BREAST BIOPSY Right 2018   CESAREAN SECTION      Home Medications:  Allergies as of 11/22/2023   No Known Allergies      Medication List        Accurate as of November 22, 2023 11:37 AM. If you have any questions, ask your nurse or doctor.          amoxicillin -clavulanate 875-125 MG tablet Commonly known as: AUGMENTIN  Take 1 tablet by mouth every 12 (twelve) hours.   ciprofloxacin 500 MG tablet Commonly known as: CIPRO Take 1 tablet (500 mg total) by mouth every 12 (twelve) hours.   HYDROcodone-acetaminophen 5-325 MG tablet Commonly known as: NORCO/VICODIN Take 1 tablet by mouth every 4 (four) hours as needed for moderate pain (pain score 4-6).   losartan 25 MG tablet Commonly known as: COZAAR   meloxicam 15 MG tablet Commonly known as: MOBIC Take 1 tablet every day by oral route.   ondansetron 4 MG disintegrating tablet Commonly known as: ZOFRAN-ODT Take 1 tablet (4 mg total) by mouth every 8 (eight) hours as needed for nausea or vomiting.   ONE-A-DAY WOMENS VITACRAVES PO Take by mouth.   oxyCODONE 5 MG immediate release tablet Commonly known as: Roxicodone Take 1 tablet (5 mg total) by mouth every 4 (four) hours as needed for up to 20 doses for breakthrough pain.   promethazine 25 MG suppository Commonly known as: PHENERGAN Place 1 suppository (25 mg total) rectally every 6 (six) hours as needed for nausea or vomiting.   tamsulosin 0.4 MG Caps capsule Commonly known as: FLOMAX Take 1  capsule (0.4 mg total) by mouth daily. Started by: Clarnce Homan   VITAMIN D3 PO Take by mouth.   Wegovy 0.25 MG/0.5ML Soaj SQ injection Generic drug: semaglutide-weight management   Wegovy 1 MG/0.5ML Soaj SQ injection Generic drug: semaglutide-weight management Inject 1 mg into the skin once a week.   ZINC 15 PO Take by mouth.        Allergies: No Known Allergies  Family History: Family History  Problem Relation Age of Onset   Breast  cancer Mother 2       breast, thyroid , uterine; skin cancer[place]   Breast cancer Maternal Aunt     Social History:  reports that she has been smoking cigarettes. She started smoking about 35 years ago. She has a 17.9 pack-year smoking history. She has never used smokeless tobacco. She reports current drug use. Drug: Marijuana. She reports that she does not drink alcohol.  ROS: Pertinent ROS in HPI  Physical Exam: BP (!) 129/90   Pulse 87   Ht 4' 11 (1.499 m)   Wt 156 lb (70.8 kg)   LMP 10/29/2023   BMI 31.51 kg/m   Constitutional:  Well nourished. Alert and oriented, No acute distress. HEENT: [place] AT, moist mucus membranes.  Trachea midline Cardiovascular: No clubbing, cyanosis, or edema. Respiratory: Normal respiratory effort, no increased work of breathing. Neurologic: Grossly intact, no focal deficits, moving all 4 extremities. Psychiatric: Normal mood and affect.    Laboratory Data: See Epic and HPI   I have reviewed the labs.   Pertinent Imaging: KUB, radiologist interpretation still pending  I have independently reviewed the films.  See HPI.    Assessment & Plan:    1. Left ureteral stones - We discussed various treatment options for urolithiasis including observation with or without medical expulsive therapy, shockwave lithotripsy (SWL), ureteroscopy and laser lithotripsy with stent placement and given stone size, stone location and stone density, explained that ESWL has a lower stone-free rate in a single procedure, but also a lower complication rate compared to ureteroscopy, and avoids a stent and associated stent related symptoms. Possible complications include renal hematoma, steinstrasse, and the need for additional treatment, explained that ureteroscopy with laser lithotripsy and stent placement has a higher stone-free rate than SWL in a single procedure; however, there is an increased complication rate, including possible infection,  ureteral injury, bleeding, and stent-related morbidity. Common stent-related symptoms include dysuria, urgency/frequency, and flank pain. -She has chosen ureteroscopy for definitive stone treatment -Patient counseled on operative technique and associated risks. Informed of planned ureteral stent placement, expected to remain in situ for 3-10 days, with potential for stent-related symptoms including flank pain, bladder discomfort, dysuria, urgency, frequency, urinary incontinence, and gross hematuria. -Stent removal options reviewed--either via office cystoscopy or self-removal via extraction string, to be determined intraoperatively. -Residual nephrolithiasis or ureterolithiasis may persist post-procedure, potentially requiring future intervention. -Intraoperative risks include ureteral injury, which may necessitate open surgical repair. Additional risks include hemorrhage, infection, and complications related to general anesthesia (e.g., MI, CVA, paralysis, coma, death). -Patient advised to contact clinic or present to ED for fever, uncontrolled pain, or intractable emesis to facilitate urgent evaluation and management. -UA w/ heme, mild pyuria and few bacteria -urine culture pending -refill tamsulosin 0.4 mg -she had enough pain medication and anti-nausea medication on hand -booking sheet in     Return for left , URS/LL/ureteral stent placement w/ Dr. [name] .  These notes generated with voice recognition software. I apologize for  typographical errors.  CLOTILDA CORNWALL, PA-C  [facility] [address]  [address] [place], [place] [address] [phone]  Surgical Physician Order Form [facility]  * Scheduling expectation : Next Available with Dr. [name]   *Length of Case:   *Clearance needed: no  *Anticoagulation Instructions: N/A  *Aspirin Instructions: N/A  *Post-op visit Date/Instructions:  TBD  *Diagnosis: Left Ureteral  Stone  *Procedure: left Ureteroscopy w/laser lithotripsy & stent placement (47643)   Additional orders: N/A  -Admit type: OUTpatient  -Anesthesia: General  -VTE Prophylaxis Standing Order SCD's       Other:   -Standing Lab Orders Per Anesthesia    Lab other: Urine Pregnancy  -Standing Test orders EKG/Chest x-ray per Anesthesia       Test other:   - Medications:  Ancef 2gm IV  -Other orders:  N/A

## 2023-11-20 NOTE — Progress Notes (Unsigned)
 11/22/2023 7:09 PM   Regina Lowe [birth year] [id number]  Referring provider: Center, [facility] [address] [place],  [place] [address]  Urological history: 1. Nephrolithiasis -  CT (09//2025) Mild left hydronephrosis without hydroureter or obstructing stone, question mild UPJ configuration or recently passed stone. Possible mild fat stranding at the left renal pelvis, findings could be secondary to superimposed upper UTI.  Multiple left-sided kidney stones measuring up to 8 mm in aggregate. - CT (897974)  Obstructive Uropathy on the Left now due to 7 mm stones located at both the left UPJ AND the UVJ, with mild to moderate regional inflammation at the latter.  Minimal right nephrolithiasis. - KUB (10/2023) 7 mm calcification to the left of L3 corresponds to ureteral stone on prior exam. Two adjacent stones in the region of the left ureterovesicular junction are unchanged.  No chief complaint on file.  HPI: Regina Lowe is a 43 y.o. woman who presents today for KUB to see the progression of her left ureteral stones after MET.   Previous records reviewed.  KUB ***  UA ***  Serum creatinine (10/2023) 0.75, eGFR >60  PMH: No past medical history on file.  Surgical History: Past Surgical History:  Procedure Laterality Date   BREAST BIOPSY Right 2018   CESAREAN SECTION      Home Medications:  Allergies as of 11/22/2023   No Known Allergies      Medication List        Accurate as of November 20, 2023  7:09 PM. If you have any questions, ask your nurse or doctor.          amoxicillin -clavulanate 875-125 MG tablet Commonly known as: AUGMENTIN  Take 1 tablet by mouth every 12 (twelve) hours.   ciprofloxacin 500 MG tablet Commonly known as: CIPRO Take 1 tablet (500 mg total) by mouth every 12 (twelve) hours.   HYDROcodone-acetaminophen 5-325 MG tablet Commonly known as: NORCO/VICODIN Take 1 tablet by mouth every 4 (four) hours as needed for  moderate pain (pain score 4-6).   losartan 25 MG tablet Commonly known as: COZAAR   meloxicam 15 MG tablet Commonly known as: MOBIC Take 1 tablet every day by oral route.   ondansetron 4 MG disintegrating tablet Commonly known as: ZOFRAN-ODT Take 1 tablet (4 mg total) by mouth every 8 (eight) hours as needed for nausea or vomiting.   ONE-A-DAY WOMENS VITACRAVES PO Take by mouth.   oxyCODONE 5 MG immediate release tablet Commonly known as: Roxicodone Take 1 tablet (5 mg total) by mouth every 4 (four) hours as needed for up to 20 doses for breakthrough pain.   promethazine 25 MG suppository Commonly known as: PHENERGAN Place 1 suppository (25 mg total) rectally every 6 (six) hours as needed for nausea or vomiting.   VITAMIN D3 PO Take by mouth.   Wegovy 0.25 MG/0.5ML Soaj SQ injection Generic drug: semaglutide-weight management   Wegovy 1 MG/0.5ML Soaj SQ injection Generic drug: semaglutide-weight management Inject 1 mg into the skin once a week.   ZINC 15 PO Take by mouth.        Allergies: No Known Allergies  Family History: Family History  Problem Relation Age of Onset   Breast cancer Mother 73       breast, thyroid , uterine; skin cancer[place]   Breast cancer Maternal Aunt     Social History:  reports that she has been smoking cigarettes. She started smoking about 35 years ago. She has  a 17.9 pack-year smoking history. She has never used smokeless tobacco. She reports current drug use. Drug: Marijuana. She reports that she does not drink alcohol.  ROS: Pertinent ROS in HPI  Physical Exam: LMP 10/29/2023   Constitutional:  Well nourished. Alert and oriented, No acute distress. HEENT: [place] AT, moist mucus membranes.  Trachea midline, no masses. Cardiovascular: No clubbing, cyanosis, or edema. Respiratory: Normal respiratory effort, no increased work of breathing. GU: No CVA tenderness.  No bladder fullness or masses.  Recession of  labia minora, dry, pale vulvar vaginal mucosa and loss of mucosal ridges and folds.  Normal urethral meatus, no lesions, no prolapse, no discharge.   No urethral masses, tenderness and/or tenderness. No bladder fullness, tenderness or masses. *** vagina mucosa, *** estrogen effect, no discharge, no lesions, *** pelvic support, *** cystocele and *** rectocele noted.  No cervical motion tenderness.  Uterus is freely mobile and non-fixed.  No adnexal/parametria masses or tenderness noted.  Anus and perineum are without rashes or lesions.   ***  Neurologic: Grossly intact, no focal deficits, moving all 4 extremities. Psychiatric: Normal mood and affect.    Laboratory Data: See Epic and HPI   I have reviewed the labs.   Pertinent Imaging: KUB, radiologist interpretation still pending  I have independently reviewed the films.  See HPI.    Assessment & Plan:  ***  1. Left ureteral stones - We discussed various treatment options for urolithiasis including observation with or without medical expulsive therapy, shockwave lithotripsy (SWL), ureteroscopy and laser lithotripsy with stent placement and given stone size, stone location and stone density, explained that ESWL has a lower stone-free rate in a single procedure, but also a lower complication rate compared to ureteroscopy, and avoids a stent and associated stent related symptoms. Possible complications include renal hematoma, steinstrasse, and the need for additional treatment, explained that ureteroscopy with laser lithotripsy and stent placement has a higher stone-free rate than SWL in a single procedure; however, there is an increased complication rate, including possible infection, ureteral injury, bleeding, and stent-related morbidity. Common stent-related symptoms include dysuria, urgency/frequency, and flank pain.       No follow-ups on file.  These notes generated with voice recognition software. I apologize for typographical  errors.  CLOTILDA HELON RIGGERS  [facility] [address]  [address] [place], [place] [address] [phone]

## 2023-11-21 ENCOUNTER — Ambulatory Visit
Admission: RE | Admit: 2023-11-21 | Discharge: 2023-11-21 | Disposition: A | Discharge status: Home / Self Care | Attending: Urology | Admitting: Urology

## 2023-11-21 ENCOUNTER — Ambulatory Visit
Admission: RE | Admit: 2023-11-21 | Discharge: 2023-11-21 | Disposition: A | Discharge status: Home / Self Care | Source: Ambulatory Visit | Attending: Urology | Admitting: Urology

## 2023-11-21 DIAGNOSIS — N201 Calculus of ureter: Secondary | ICD-10-CM | POA: Insufficient documentation

## 2023-11-22 ENCOUNTER — Other Ambulatory Visit: Payer: Self-pay

## 2023-11-22 ENCOUNTER — Encounter: Payer: Self-pay | Admitting: Urology

## 2023-11-22 ENCOUNTER — Ambulatory Visit: Admitting: Urology

## 2023-11-22 ENCOUNTER — Ambulatory Visit (INDEPENDENT_AMBULATORY_CARE_PROVIDER_SITE_OTHER): Admitting: Urology

## 2023-11-22 VITALS — BP 129/90 | HR 87 | Ht 59.0 in | Wt 156.0 lb

## 2023-11-22 DIAGNOSIS — N201 Calculus of ureter: Secondary | ICD-10-CM | POA: Diagnosis not present

## 2023-11-22 DIAGNOSIS — R3129 Other microscopic hematuria: Secondary | ICD-10-CM

## 2023-11-22 LAB — MICROSCOPIC EXAMINATION: RBC, Urine: >30 /HPF — AB (ref 0–2)

## 2023-11-22 LAB — URINALYSIS, COMPLETE
Bilirubin, UA: NEGATIVE
Glucose, UA: NEGATIVE
Ketones, UA: NEGATIVE
Nitrite, UA: NEGATIVE
Specific Gravity, UA: 1.025 (ref 1.005–1.030)
Urobilinogen, Ur: 0.2 mg/dL (ref 0.2–1.0)
pH, UA: 6 (ref 5.0–7.5)

## 2023-11-22 MED ORDER — TAMSULOSIN HCL 0.4 MG PO CAPS: 0.4000 mg | ORAL_CAPSULE | Freq: Every day | ORAL | 0 refills | Status: AC

## 2023-11-22 NOTE — Progress Notes (Unsigned)
 Surgical Physician Order Form [facility] [place]  * Scheduling expectation : Next Available with Dr. [name]   *Length of Case:   *Clearance needed: no  *Anticoagulation Instructions: N/A  *Aspirin Instructions: N/A  *Post-op visit Date/Instructions:   TBD  *Diagnosis: Left Ureteral Stone  *Procedure: left Ureteroscopy w/laser lithotripsy & stent placement (47643)   Additional orders: N/A  -Admit type: OUTpatient  -Anesthesia: General  -VTE Prophylaxis Standing Order SCD's       Other:   -Standing Lab Orders Per Anesthesia    Lab other: Urine Pregnancy  -Standing Test orders EKG/Chest x-ray per Anesthesia       Test other:   - Medications:  Ancef 2gm IV  -Other orders:  N/A

## 2023-11-23 ENCOUNTER — Telehealth: Payer: Self-pay

## 2023-11-23 NOTE — Telephone Encounter (Signed)
 Per Dr. [name],  Patient is to be scheduled for Left Ureteroscopy with Laser Lithotripsy and Stent Placement   [name] was contacted and possible surgical dates were discussed, Thursday November 6th, 2025 was agreed upon for surgery.   Patient was directed to call [phone] between 1-3pm the day before surgery to find out surgical arrival time.  Instructions were given not to eat or drink from midnight on the night before surgery and have a driver for the day of surgery. On the surgery day patient was instructed to enter through the Medical Mall entrance of [facility] report the Same Day Surgery desk.   Pre-Admit Testing will be in contact via phone to set up an interview with the anesthesia team to review your history and medications prior to surgery.   Reminder of this information was sent via MyChart to the patient.

## 2023-11-23 NOTE — Progress Notes (Signed)
   [facility] Surgical Posting Form  Surgery Date: Date: 12/06/2023  Surgeon: Dr. Glendia Barba, MD  Inpt ( No  )   Outpt (Yes)   Obs ( No  )   Diagnosis: N20.1 Left Ureteral Stone  -CPT: [phone]  Surgery: Left Ureteroscopy with Laser Lithotripsy and Stent Placement  Stop Anticoagulations: No  Cardiac/Medical/Pulmonary Clearance needed: no  *Orders entered into EPIC  Date: 11/23/23   *Case booked in [place]  Date: 11/23/23  *Notified pt of Surgery: Date: 11/23/23  PRE-OP UA & CX: no  *Placed into Prior Authorization Work [place] Date: 11/23/23  Assistant/laser/rep:No

## 2023-11-25 ENCOUNTER — Ambulatory Visit: Payer: Self-pay | Admitting: Urology

## 2023-11-25 LAB — CULTURE, URINE COMPREHENSIVE

## 2023-11-29 ENCOUNTER — Encounter
Admission: RE | Admit: 2023-11-29 | Discharge: 2023-11-29 | Disposition: A | Discharge status: Home / Self Care | Source: Ambulatory Visit | Attending: Urology | Admitting: Urology

## 2023-11-29 ENCOUNTER — Other Ambulatory Visit: Payer: Self-pay

## 2023-11-29 DIAGNOSIS — Z01818 Encounter for other preprocedural examination: Secondary | ICD-10-CM

## 2023-11-29 HISTORY — DX: Calculus of ureter: N20.1

## 2023-11-29 HISTORY — DX: Nicotine dependence, cigarettes, uncomplicated: F17.210

## 2023-11-29 HISTORY — DX: Hyperlipidemia, unspecified: E78.5

## 2023-11-29 HISTORY — DX: Essential (primary) hypertension: I10

## 2023-11-29 HISTORY — DX: Prediabetes: R73.03

## 2023-11-29 NOTE — Patient Instructions (Signed)
 Your procedure is scheduled on:12-06-23 Thursday Report to the Registration Desk on the 1st floor of the Medical Mall.Then proceed to the 2nd floor Surgery Desk To find out your arrival time, please call [phone] between 1PM - 3PM on:12-05-23 Wednesday If your arrival time is 6:00 am, do not arrive before that time as the Medical Mall entrance doors do not open until 6:00 am.  REMEMBER: Instructions that are not followed completely may result in serious medical risk, up to and including death; or upon the discretion of your surgeon and anesthesiologist your surgery may need to be rescheduled.  Do not eat food OR drink liquids after midnight the night before surgery.  No gum chewing or hard candies.  One week prior to surgery:Stop NOW (11-29-23) Stop Anti-inflammatories (NSAIDS) such as Advil, Aleve, Ibuprofen, Motrin, Naproxen, Naprosyn and Aspirin based products such as Excedrin, Goody's Powder, BC Powder. Stop ANY OVER THE COUNTER supplements until after surgery (Multivitamin, Zinc)  You may however, continue to take Tylenol if needed for pain up until the day of surgery.  Continue taking all of your other prescription medications up until the day of surgery.  ON THE DAY OF SURGERY ONLY TAKE THESE MEDICATIONS WITH SIPS OF WATER: -You may take Hydrocodone OR Oxycodone if needed for pain  No Alcohol for 24 hours before or after surgery.  No Smoking including e-cigarettes for 24 hours before surgery.  No chewable tobacco products for at least 6 hours before surgery.  No nicotine patches on the day of surgery.  Do not use any recreational drugs for at least a week (preferably 2 weeks) before your surgery.  Please be advised that the combination of cocaine and anesthesia may have negative outcomes, up to and including death. If you test positive for cocaine, your surgery will be cancelled.  On the morning of surgery brush your teeth with toothpaste and water, you may rinse your  mouth with mouthwash if you wish. Do not swallow any toothpaste or mouthwash.  Do not wear jewelry, make-up, hairpins, clips or nail polish.  For welded (permanent) jewelry: bracelets, anklets, waist bands, etc.  Please have this removed prior to surgery.  If it is not removed, there is a chance that hospital personnel will need to cut it off on the day of surgery.  Do not wear lotions, powders, or perfumes.   Do not shave body hair from the neck down 48 hours before surgery.  Contact lenses, hearing aids and dentures may not be worn into surgery.  Do not bring valuables to the hospital. [facility] is not responsible for any missing/lost belongings or valuables.   Notify your doctor if there is any change in your medical condition (cold, fever, infection).  Wear comfortable clothing (specific to your surgery type) to the hospital.  After surgery, you can help prevent lung complications by doing breathing exercises.  Take deep breaths and cough every 1-2 hours. Your doctor may order a device called an Incentive Spirometer to help you take deep breaths. When coughing or sneezing, hold a pillow firmly against your incision with both hands. This is called "splinting." Doing this helps protect your incision. It also decreases belly discomfort.  If you are being admitted to the hospital overnight, leave your suitcase in the car. After surgery it may be brought to your room.  In case of increased patient census, it may be necessary for you, the patient, to continue your postoperative care in the Same Day Surgery department.  If you  are being discharged the day of surgery, you will not be allowed to drive home. You will need a responsible individual to drive you home and stay with you for 24 hours after surgery.   If you are taking public transportation, you will need to have a responsible individual with you.  Please call the Pre-admissions Testing Dept. at [phone] if you have any  questions about these instructions.  Surgery Visitation Policy:  Patients having surgery or a procedure may have two visitors.  Children under the age of 41 must have an adult with them who is not the patient.   Merchandiser, Retail to address health-related social needs:  [email or web address]

## 2023-12-05 MED ORDER — CHLORHEXIDINE GLUCONATE 0.12 % MT SOLN
15.0000 mL | Freq: Once | OROMUCOSAL | Status: AC
  Administered 2023-12-06: 15 mL via OROMUCOSAL

## 2023-12-05 MED ORDER — LACTATED RINGERS IV SOLN: INTRAVENOUS | Status: DC

## 2023-12-05 MED ORDER — ORAL CARE MOUTH RINSE: 15.0000 mL | Freq: Once | OROMUCOSAL | Status: AC

## 2023-12-05 MED ORDER — CEFAZOLIN SODIUM-DEXTROSE 2-4 GM/100ML-% IV SOLN
2.0000 g | INTRAVENOUS | Status: AC
  Administered 2023-12-06: 2 g via INTRAVENOUS

## 2023-12-06 ENCOUNTER — Encounter: Payer: Self-pay | Admitting: Urology

## 2023-12-06 ENCOUNTER — Other Ambulatory Visit: Payer: Self-pay

## 2023-12-06 ENCOUNTER — Ambulatory Visit

## 2023-12-06 ENCOUNTER — Encounter
Admission: RE | Disposition: A | Discharge status: Home / Self Care | Payer: Self-pay | Source: Home / Self Care | Attending: Urology

## 2023-12-06 ENCOUNTER — Ambulatory Visit: Payer: Self-pay | Admitting: Anesthesiology

## 2023-12-06 ENCOUNTER — Ambulatory Visit
Admission: RE | Admit: 2023-12-06 | Discharge: 2023-12-06 | Disposition: A | Discharge status: Home / Self Care | Attending: Urology | Admitting: Urology

## 2023-12-06 DIAGNOSIS — Z01818 Encounter for other preprocedural examination: Secondary | ICD-10-CM

## 2023-12-06 DIAGNOSIS — I10 Essential (primary) hypertension: Secondary | ICD-10-CM | POA: Diagnosis not present

## 2023-12-06 DIAGNOSIS — N202 Calculus of kidney with calculus of ureter: Secondary | ICD-10-CM | POA: Diagnosis not present

## 2023-12-06 DIAGNOSIS — F17210 Nicotine dependence, cigarettes, uncomplicated: Secondary | ICD-10-CM | POA: Diagnosis not present

## 2023-12-06 DIAGNOSIS — N201 Calculus of ureter: Secondary | ICD-10-CM

## 2023-12-06 HISTORY — PX: CYSTOSCOPY/URETEROSCOPY/HOLMIUM LASER/STENT PLACEMENT: SHX6546

## 2023-12-06 LAB — POCT PREGNANCY, URINE: Preg Test, Ur: NEGATIVE

## 2023-12-06 SURGERY — CYSTOSCOPY/URETEROSCOPY/HOLMIUM LASER/STENT PLACEMENT
Anesthesia: General | Site: Ureter | Laterality: Left

## 2023-12-06 MED ORDER — ONDANSETRON HCL 4 MG/2ML IJ SOLN
INTRAMUSCULAR | Status: DC | PRN
  Administered 2023-12-06 (×2): 4 mg via INTRAVENOUS

## 2023-12-06 MED ORDER — ACETAMINOPHEN 10 MG/ML IV SOLN
INTRAVENOUS | Status: AC
  Filled 2023-12-06: qty 100

## 2023-12-06 MED ORDER — ACETAMINOPHEN 10 MG/ML IV SOLN
INTRAVENOUS | Status: DC | PRN
  Administered 2023-12-06: 1000 mg via INTRAVENOUS

## 2023-12-06 MED ORDER — GLYCOPYRROLATE 0.2 MG/ML IJ SOLN
INTRAMUSCULAR | Status: DC | PRN
  Administered 2023-12-06: .2 mg via INTRAVENOUS

## 2023-12-06 MED ORDER — MIDAZOLAM HCL 2 MG/2ML IJ SOLN
INTRAMUSCULAR | Status: AC
  Filled 2023-12-06: qty 2

## 2023-12-06 MED ORDER — FENTANYL CITRATE (PF) 100 MCG/2ML IJ SOLN
INTRAMUSCULAR | Status: AC
  Filled 2023-12-06: qty 2

## 2023-12-06 MED ORDER — PROPOFOL 10 MG/ML IV BOLUS
INTRAVENOUS | Status: DC | PRN
  Administered 2023-12-06: 150 mg via INTRAVENOUS

## 2023-12-06 MED ORDER — PROPOFOL 1000 MG/100ML IV EMUL
INTRAVENOUS | Status: AC
  Filled 2023-12-06: qty 100

## 2023-12-06 MED ORDER — KETOROLAC TROMETHAMINE 30 MG/ML IJ SOLN
INTRAMUSCULAR | Status: DC | PRN
  Administered 2023-12-06: 30 mg via INTRAVENOUS

## 2023-12-06 MED ORDER — ROCURONIUM BROMIDE 100 MG/10ML IV SOLN
INTRAVENOUS | Status: DC | PRN
  Administered 2023-12-06: 50 mg via INTRAVENOUS

## 2023-12-06 MED ORDER — SODIUM CHLORIDE 0.9 % IR SOLN
Status: DC | PRN
  Administered 2023-12-06: 3000 mL

## 2023-12-06 MED ORDER — CHLORHEXIDINE GLUCONATE 0.12 % MT SOLN
OROMUCOSAL | Status: AC
  Filled 2023-12-06: qty 15

## 2023-12-06 MED ORDER — STERILE WATER FOR IRRIGATION IR SOLN
Status: DC | PRN
  Administered 2023-12-06: 500 mL

## 2023-12-06 MED ORDER — OXYBUTYNIN CHLORIDE 5 MG PO TABS: ORAL_TABLET | ORAL | 0 refills | Status: DC

## 2023-12-06 MED ORDER — SUGAMMADEX SODIUM 200 MG/2ML IV SOLN
INTRAVENOUS | Status: DC | PRN
  Administered 2023-12-06: 200 mg via INTRAVENOUS

## 2023-12-06 MED ORDER — PHENYLEPHRINE 80 MCG/ML (10ML) SYRINGE FOR IV PUSH (FOR BLOOD PRESSURE SUPPORT)
PREFILLED_SYRINGE | INTRAVENOUS | Status: DC | PRN
  Administered 2023-12-06 (×2): 160 ug via INTRAVENOUS

## 2023-12-06 MED ORDER — DEXMEDETOMIDINE HCL IN NACL 200 MCG/50ML IV SOLN
INTRAVENOUS | Status: DC | PRN
  Administered 2023-12-06: 12 ug via INTRAVENOUS

## 2023-12-06 MED ORDER — DEXAMETHASONE SOD PHOSPHATE PF 10 MG/ML IJ SOLN
INTRAMUSCULAR | Status: DC | PRN
  Administered 2023-12-06: 10 mg via INTRAVENOUS

## 2023-12-06 MED ORDER — LIDOCAINE HCL (CARDIAC) PF 100 MG/5ML IV SOSY
PREFILLED_SYRINGE | INTRAVENOUS | Status: DC | PRN
  Administered 2023-12-06: 100 mg via INTRAVENOUS

## 2023-12-06 MED ORDER — MIDAZOLAM HCL (PF) 2 MG/2ML IJ SOLN
INTRAMUSCULAR | Status: DC | PRN
  Administered 2023-12-06: 2 mg via INTRAVENOUS

## 2023-12-06 MED ORDER — IOHEXOL 180 MG/ML  SOLN
INTRAMUSCULAR | Status: DC | PRN
  Administered 2023-12-06: 10 mL

## 2023-12-06 MED ORDER — FENTANYL CITRATE (PF) 100 MCG/2ML IJ SOLN
INTRAMUSCULAR | Status: DC | PRN
  Administered 2023-12-06 (×2): 50 ug via INTRAVENOUS

## 2023-12-06 MED ORDER — CEFAZOLIN SODIUM-DEXTROSE 2-4 GM/100ML-% IV SOLN
INTRAVENOUS | Status: AC
  Filled 2023-12-06: qty 100

## 2023-12-06 SURGICAL SUPPLY — 21 items
BAG DRAIN SIEMENS DORNER NS (MISCELLANEOUS) ×1 IMPLANT
BASKET ZERO TIP 1.9FR (BASKET) IMPLANT
BRUSH SCRUB EZ 4% CHG (MISCELLANEOUS) ×1 IMPLANT
CATH URET FLEX-TIP 2 LUMEN 10F (CATHETERS) IMPLANT
CATH URETL OPEN END 6X70 (CATHETERS) IMPLANT
DRAPE UTILITY 15X26 TOWEL STRL (DRAPES) ×1 IMPLANT
FIBER LASER MOSES 200 DFL (Laser) IMPLANT
GLOVE BIOGEL PI IND STRL 7.5 (GLOVE) ×1 IMPLANT
GOWN STRL REUS W/ TWL LRG LVL3 (GOWN DISPOSABLE) ×1 IMPLANT
GOWN STRL REUS W/ TWL XL LVL3 (GOWN DISPOSABLE) ×1 IMPLANT
GUIDEWIRE STR DUAL SENSOR (WIRE) ×1 IMPLANT
KIT TURNOVER CYSTO (KITS) ×1 IMPLANT
PACK CYSTO AR (MISCELLANEOUS) ×1 IMPLANT
SET CYSTO IRRIGATION (SET/KITS/TRAYS/PACK) ×1 IMPLANT
SHEATH NAVIGATOR HD 12/14X36 (SHEATH) IMPLANT
SOL .9 NS 3000ML IRR UROMATIC (IV SOLUTION) ×1 IMPLANT
STENT URET 6FRX24 CONTOUR (STENTS) IMPLANT
STENT URET 6FRX26 CONTOUR (STENTS) IMPLANT
SURGILUBE 2OZ TUBE FLIPTOP (MISCELLANEOUS) ×1 IMPLANT
VALVE UROSEAL ADJ ENDO (VALVE) IMPLANT
WATER STERILE IRR 500ML POUR (IV SOLUTION) ×1 IMPLANT

## 2023-12-06 NOTE — Anesthesia Preprocedure Evaluation (Signed)
 Anesthesia Evaluation  Patient identified by MRN, date of birth, ID band Patient awake    Reviewed: Allergy & Precautions, NPO status , Patient's Chart, lab work & pertinent test results  History of Anesthesia Complications Negative for: history of anesthetic complications  Airway Mallampati: III  TM Distance: <3 FB Neck ROM: full    Dental  (+) Chipped, Poor Dentition, Missing   Pulmonary neg shortness of breath, Current Smoker and Patient abstained from smoking.   Pulmonary exam normal        Cardiovascular Exercise Tolerance: Good hypertension, (-) Past MI Normal cardiovascular exam     Neuro/Psych negative neurological ROS  negative psych ROS   GI/Hepatic negative GI ROS,,,(+)     substance abuse  marijuana use  Endo/Other  negative endocrine ROS    Renal/GU      Musculoskeletal   Abdominal   Peds  Hematology negative hematology ROS (+)   Anesthesia Other Findings Past Medical History: No date: Cigarette smoker No date: Hyperlipidemia No date: Hypertension     Comment:  was on losartan but pcp took pt off due to bp control No date: Left ureteral stone No date: Pre-diabetes  Past Surgical History: 2018: BREAST BIOPSY; Right No date: CESAREAN SECTION     Comment:  x3  BMI    Body Mass Index: 31.31 kg/m      Reproductive/Obstetrics negative OB ROS                              Anesthesia Physical Anesthesia Plan  ASA: 2  Anesthesia Plan: General ETT   Post-op Pain Management:    Induction: Intravenous  PONV Risk Score and Plan: Ondansetron, Dexamethasone, Midazolam and Treatment may vary due to age or medical condition  Airway Management Planned: Oral ETT  Additional Equipment:   Intra-op Plan:   Post-operative Plan: Extubation in OR  Informed Consent: I have reviewed the patients History and Physical, chart, labs and discussed the procedure including the  risks, benefits and alternatives for the proposed anesthesia with the patient or authorized representative who has indicated his/her understanding and acceptance.     Dental Advisory Given  Plan Discussed with: Anesthesiologist, CRNA and Surgeon  Anesthesia Plan Comments: (Patient consented for risks of anesthesia including but not limited to:  - adverse reactions to medications - damage to eyes, teeth, lips or other oral mucosa - nerve damage due to positioning  - sore throat or hoarseness - Damage to heart, brain, nerves, lungs, other parts of body or loss of life  Patient voiced understanding and assent.)        Anesthesia Quick Evaluation

## 2023-12-06 NOTE — Interval H&P Note (Signed)
 History and Physical Interval Note:  12/06/2023 7:14 AM  Regina Lowe  has presented today for surgery, with the diagnosis of Left Ureteral Stone.  The various methods of treatment have been discussed with the patient and family. After consideration of risks, benefits and other options for treatment, the patient has consented to  Procedure(s): CYSTOSCOPY/URETEROSCOPY/HOLMIUM LASER/STENT PLACEMENT (Left) as a surgical intervention.  The patient's history has been reviewed, patient examined, no change in status, stable for surgery.  I have reviewed the patient's chart and labs.  Questions were answered to the patient's satisfaction.    CV:RRR Lungs:clear  Glendia JAYSON Barba

## 2023-12-06 NOTE — Anesthesia Postprocedure Evaluation (Signed)
 Anesthesia Post Note  Patient: Regina Lowe  Procedure(s) Performed: CYSTOSCOPY/URETEROSCOPY/HOLMIUM LASER/STENT PLACEMENT (Left: Ureter)  Patient location during evaluation: PACU Anesthesia Type: General Level of consciousness: awake and alert Pain management: pain level controlled Vital Signs Assessment: post-procedure vital signs reviewed and stable Respiratory status: spontaneous breathing, nonlabored ventilation and respiratory function stable Cardiovascular status: blood pressure returned to baseline and stable Postop Assessment: no apparent nausea or vomiting Anesthetic complications: no   No notable events documented.   Last Vitals:  Vitals:   12/06/23 0852 12/06/23 0900  BP: 102/69 105/70  Pulse: 78 84  Resp: 15 16  Temp: (!) 36.4 C (!) 36.4 C  SpO2: 98% 100%    Last Pain:  Vitals:   12/06/23 0900  TempSrc: Oral  PainSc:                  Fairy POUR Amyri Frenz

## 2023-12-06 NOTE — Transfer of Care (Signed)
 Immediate Anesthesia Transfer of Care Note  Patient: Regina Lowe  Procedure(s) Performed: CYSTOSCOPY/URETEROSCOPY/HOLMIUM LASER/STENT PLACEMENT (Left: Ureter)  Patient Location: PACU  Anesthesia Type:General  Level of Consciousness: awake, drowsy, and patient cooperative  Airway & Oxygen Therapy: Patient Spontanous Breathing and Patient connected to face mask oxygen  Post-op Assessment: Report given to RN and Post -op Vital signs reviewed and stable  Post vital signs: Reviewed and stable  Last Vitals:  Vitals Value Taken Time  BP 99/59 12/06/23 08:26  Temp 36.6 C 12/06/23 08:26  Pulse 93 12/06/23 08:29  Resp 13 12/06/23 08:29  SpO2 96 % 12/06/23 08:29  Vitals shown include unfiled device data.  Last Pain:  Vitals:   12/06/23 0826  TempSrc:   PainSc: 0-No pain         Complications: No notable events documented.

## 2023-12-06 NOTE — Op Note (Signed)
 Preoperative diagnosis:  Left ureteral calculi Left nephrolithiasis  Postoperative diagnosis:  Left ureteral calculus Left nephrolithiasis  Procedure:  Cystoscopy Left ureteroscopy and stone removal Ureteroscopic laser lithotripsy Left ureteral stent placement (11F/24 cm) Left retrograde pyelography with interpretation  Surgeon: Glendia C. Jannis Atkins, M.D.  Anesthesia: General  Complications: None  Intraoperative findings:  Cystoscopy: Bladder mucosa without solid or papillary lesions; UOs normal-appearing bilaterally. Ureteroscopy: Left distal ureteral calculus was no longer present.  Migration of the proximal calculus noted to the mid ureter-not impacted.  3 mm upper calyceal and lower calyceal calculi were removed Left retrograde pyelography post procedure showed no filling defects, stone fragments or contrast extravasation  EBL: Minimal  Specimens: Calculus fragments for analysis   Indication: Regina Lowe is a 43 y.o.  female with recent CT showing a 7 mm left distal ureteral calculus and 7 mm left proximal ureteral calculus with nonobstructing left renal calculi.  She is not aware of passing a stone after reviewing the management options for treatment, the patient elected to proceed with the above surgical procedure(s). We have discussed the potential benefits and risks of the procedure, side effects of the proposed treatment, the likelihood of the patient achieving the goals of the procedure, and any potential problems that might occur during the procedure or recuperation. Informed consent has been obtained.  Description of procedure:  The patient was taken to the operating room and general anesthesia was induced.  The patient was placed in the dorsal lithotomy position, prepped and draped in the usual sterile fashion, and preoperative antibiotics were administered. A preoperative time-out was performed.   A 21 French cystoscope was lubricated and placed per urethra.   Panendoscopy was performed with findings as described above  Attention was directed to the left ureteral orifice and a 0.038 Sensor wire was then advanced up the ureter into the renal pelvis under fluoroscopic guidance.  A 4.5 Fr semirigid ureteroscope was then advanced into the ureter next to the guidewire.  The left UVJ calculus was not present.  The ureteroscope was advanced proximally and not impacted calculus was identified in the mid ureter  The stone was then dusted with a 200 m Moses holmium laser fiber at a setting of 0.3 J/80 hz.   All fragments were then removed from the ureter with a zero tip nitinol basket.  Reinspection of the ureter revealed no remaining visible stones or fragments.   The ureteroscope was then advanced to the UPJ and no additional ureteral calculi were identified.  The semirigid ureteroscope was removed and a single channel digital flexible ureteroscope was placed per urethra.  The scope was easily inserted into the left ureter alongside the guidewire and advanced into the renal pelvis.  Contrast was instilled through the ureteroscope and all calyces were examined under fluoroscopic guidance with findings as described above.  The 2 small calyceal calculi were removed via a 1.9 F ZeroTip basket.  Retrograde pyelogram was performed with findings as described above.  A 6 F/24 cm Contour ureteral stent with tether was placed under fluoroscopic guidance.  The wire was then removed with an adequate stent curl noted in the renal pelvis as well as in the bladder.  The tether was cut shorter, tied and tucked into the vagina  The bladder was then emptied and the procedure ended.  The patient appeared to tolerate the procedure well and without complications.  After anesthetic reversal the patient was transported to the PACU in stable condition.   Plan: She was instructed  to remove her stent via the tether on Saturday, 12/08/2023 Postop follow-up will be scheduled ~1  month   Glendia Barba, MD

## 2023-12-06 NOTE — Anesthesia Procedure Notes (Signed)
 Procedure Name: Intubation Date/Time: 12/06/2023 7:35 AM  Performed by: Ledora Duncan, CRNAPre-anesthesia Checklist: Patient identified, Emergency Drugs available, Suction available and Patient being monitored Patient Re-evaluated:Patient Re-evaluated prior to induction Oxygen Delivery Method: Circle system utilized Preoxygenation: Pre-oxygenation with 100% oxygen Induction Type: IV induction Ventilation: Mask ventilation without difficulty Laryngoscope Size: McGrath and 3 Grade View: Grade I Tube type: Oral Tube size: 6.5 mm Number of attempts: 1 Airway Equipment and Method: Stylet Placement Confirmation: ETT inserted through vocal cords under direct vision, positive ETCO2 and breath sounds checked- equal and bilateral Secured at: 21 cm Tube secured with: Tape Dental Injury: Teeth and Oropharynx as per pre-operative assessment

## 2023-12-06 NOTE — Discharge Instructions (Addendum)
 DISCHARGE INSTRUCTIONS FOR KIDNEY STONE/URETERAL STENT   MEDICATIONS:  1. Resume all your other meds from home.  2.  AZO (over-the-counter) can help with the burning/stinging when you urinate. 3.  Oxybutynin is for bladder/stent irritation, Rx was sent to your pharmacy.  ACTIVITY:  1. May resume regular activities in 24 hours. 2. No driving while on narcotic pain medications  3. Drink plenty of water  4. Continue to walk at home - you can still get blood clots when you are at home, so keep active, but don't over do it.  5. May return to work/school tomorrow or when you feel ready   BATHING:  1. You can shower. 2. You have a string coming from your urethra: The stent string is attached to your ureteral stent. Do not pull on this.   SIGNS/SYMPTOMS TO CALL:  Common postoperative symptoms include urinary frequency, urgency, bladder spasm and blood in the urine  Please call us  if you have a fever greater than 101.5, uncontrolled nausea/vomiting, uncontrolled pain, dizziness, unable to urinate, excessively bloody urine, chest pain, shortness of breath, leg swelling, leg pain, or any other concerns or questions.   You can reach us  at [phone].   FOLLOW-UP:  1. You will be contacted for a follow-up appointment in ~1 month 2. You have a string attached to your stent, you may remove it on Saturday, 12/08/2023. To do this, pull the string until the stent is completely removed. You may feel an odd sensation in your back.

## 2023-12-07 ENCOUNTER — Encounter: Payer: Self-pay | Admitting: Urology

## 2023-12-17 LAB — STONE ANALYSIS
Calcium Oxalate Dihydrate: 30 %
Calcium Oxalate Monohydrate: 60 %
Calcium Phosphate (Hydroxyl): 10 %
Weight Calculi: 26 mg

## 2024-01-09 ENCOUNTER — Encounter: Payer: Self-pay | Admitting: Urology

## 2024-01-09 ENCOUNTER — Ambulatory Visit (INDEPENDENT_AMBULATORY_CARE_PROVIDER_SITE_OTHER): Admitting: Urology

## 2024-01-09 VITALS — BP 136/65 | HR 88 | Ht 59.0 in | Wt 159.0 lb

## 2024-01-09 DIAGNOSIS — Z09 Encounter for follow-up examination after completed treatment for conditions other than malignant neoplasm: Secondary | ICD-10-CM | POA: Diagnosis not present

## 2024-01-09 DIAGNOSIS — Z87442 Personal history of urinary calculi: Secondary | ICD-10-CM | POA: Diagnosis not present

## 2024-01-09 NOTE — Progress Notes (Signed)
 01/09/2024 8:18 AM   Regina Lowe February 02, 1980 [id number]  Referring provider: Center, [facility] [address] [place],  [place] [address]  Chief Complaint  Patient presents with   Nephrolithiasis    HPI: Regina Lowe is a 43 y.o. female who presents for postop follow-up.  Status post left ureteroscopy with laser lithotripsy/stone removal 12/06/2023.  Her distal ureter calculus was no longer present.  She had migration of a proximal calculus to the mid ureter which was treated in addition to upper and lower calyceal calculi.  Her stent was left attached to a tether which she removed 48 hours postop She is doing well and has no postoperative complaints Stone analysis mixed CaOxMono/CaOxDi 60/30   PMH: Past Medical History:  Diagnosis Date   Cigarette smoker    Hyperlipidemia    Hypertension    was on losartan but pcp took pt off due to bp control   Left ureteral stone    Pre-diabetes     Surgical History: Past Surgical History:  Procedure Laterality Date   BREAST BIOPSY Right 2018   CESAREAN SECTION     x3   CYSTOSCOPY/URETEROSCOPY/HOLMIUM LASER/STENT PLACEMENT Left 12/06/2023   Procedure: CYSTOSCOPY/URETEROSCOPY/HOLMIUM LASER/STENT PLACEMENT;  Surgeon: [name] Glendia BROCKS, MD;  Location: [facility] ORS;  Service: Urology;  Laterality: Left;    Home Medications:  Allergies as of 01/09/2024   No Known Allergies      Medication List        Accurate as of January 09, 2024  8:18 AM. If you have any questions, ask your nurse or doctor.          STOP taking these medications    oxybutynin  5 MG tablet Commonly known as: DITROPAN    oxyCODONE  5 MG immediate release tablet Commonly known as: Roxicodone        TAKE these medications    HYDROcodone -acetaminophen  5-325 MG tablet Commonly known as: NORCO/VICODIN Take 1 tablet by mouth every 4 (four) hours as needed for moderate pain (pain score 4-6).   naproxen sodium 220 MG tablet Commonly known  as: ALEVE Take 220 mg by mouth 2 (two) times daily as needed.   nystatin powder Apply 1 Application topically daily.   ONE-A-DAY WOMENS VITACRAVES PO Take 1 tablet by mouth daily.   tamsulosin  0.4 MG Caps capsule Commonly known as: FLOMAX  Take 1 capsule (0.4 mg total) by mouth daily. What changed: when to take this   ZINC 15 PO Take by mouth.        Allergies: No Known Allergies  Family History: Family History  Problem Relation Age of Onset   Breast cancer Mother 46       breast, thyroid , uterine; skin cancer[place]   Breast cancer Maternal Aunt     Social History:  reports that she has been smoking cigarettes. She started smoking about 35 years ago. She has a 18 pack-year smoking history. She has never used smokeless tobacco. She reports current drug use. Drug: Marijuana. She reports that she does not drink alcohol.   Physical Exam: BP 136/65   Pulse 88   Ht 4' 11 (1.499 m)   Wt 159 lb (72.1 kg)   BMI 32.11 kg/m   Constitutional:  Alert, No acute distress. HEENT: [place] AT Respiratory: Normal respiratory effort, no increased work of breathing. Psychiatric: Normal mood and affect.   Assessment & Plan:    1.  History urinary calculi Doing well status post ureteroscopic stone removal Discussed metabolic evaluation versus  general stone prevention guidelines and she elected the latter We discussed stone prevention guidelines including increasing water  intake to keep urine output >2.5 L/day.  We discussed at least 3 L of water  is generally enough to produce this output.  Dietary oxalate moderation including avoidance of megadose vitamin C was discussed.  Avoidance of salty and processed foods was reviewed as well as avoidance of excessive intake of dietary protein.  Increased intake of potassium rich citrus products was also discussed.  She was provided literature regarding stone prevention 74-month follow-up with [facility].  Instructed call earlier for  flank pain/renal colic     Glendia JAYSON Barba, MD  [facility] [address], [address] [place], [place] [address] [phone]

## 2024-04-11 IMAGING — MG MM DIGITAL SCREENING BILAT W/ TOMO AND CAD
8 series · 8 of 24 positions shown · non-contrast
Comparison: Previous exam(s).

CLINICAL DATA: Screening.

EXAM:
DIGITAL SCREENING BILATERAL MAMMOGRAM WITH TOMOSYNTHESIS AND CAD
TECHNIQUE: Bilateral screening digital craniocaudal and mediolateral oblique
mammograms were obtained. Bilateral screening digital breast
tomosynthesis was performed. The images were evaluated with
computer-aided detection.

[R CC synth-2D]
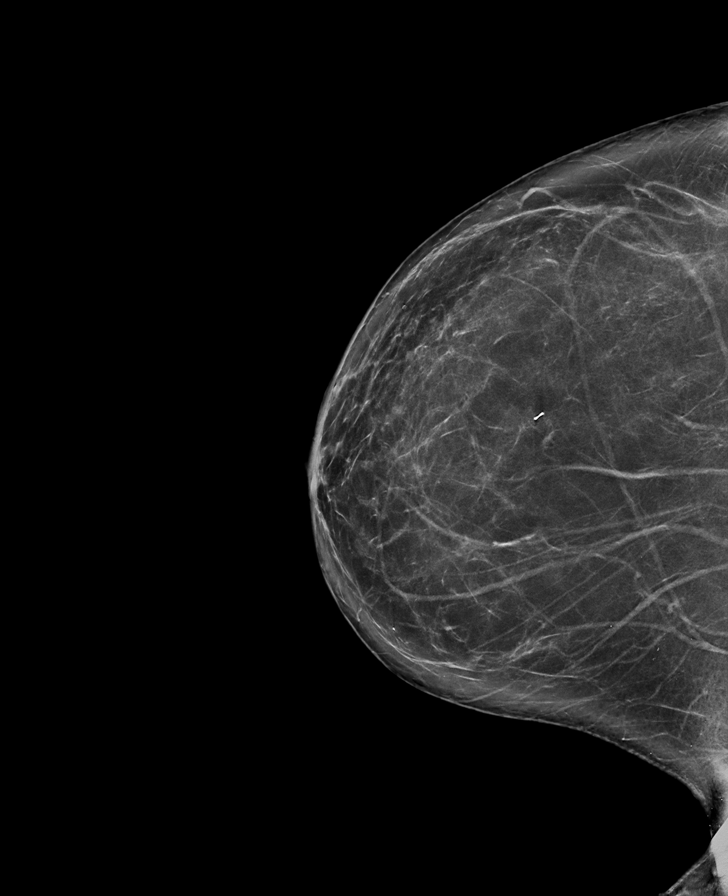

[R MLO synth-2D]
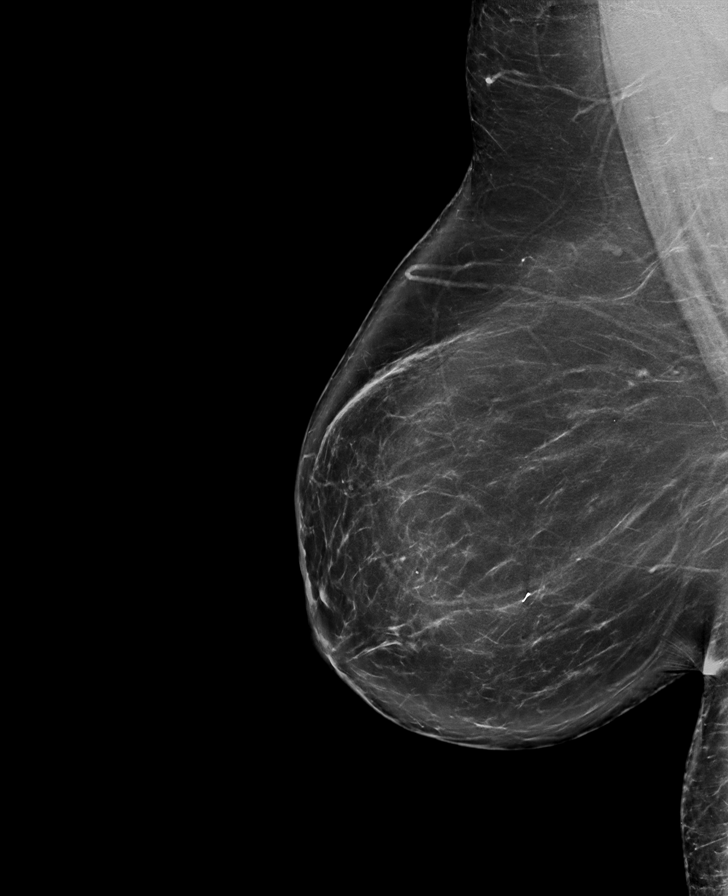

[L CC synth-2D]
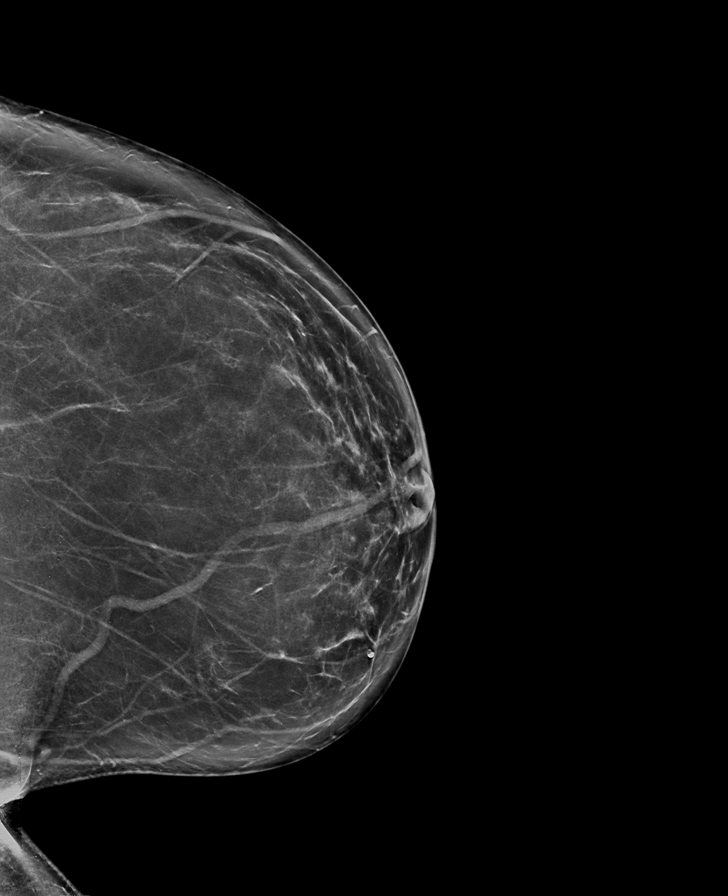

[L MLO synth-2D]
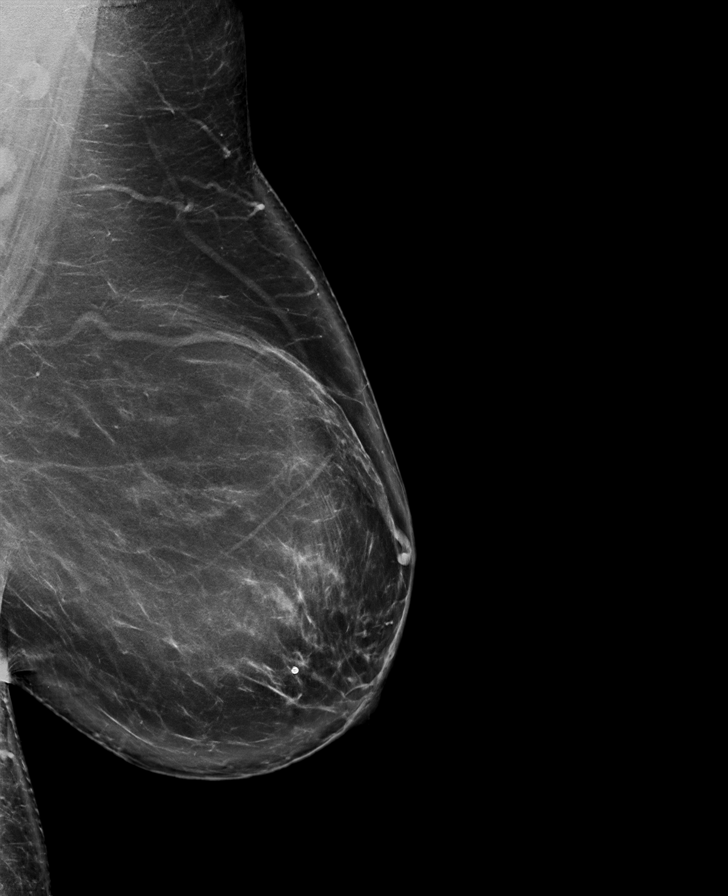

[R CC tomo · tomo slice 42/83.0]
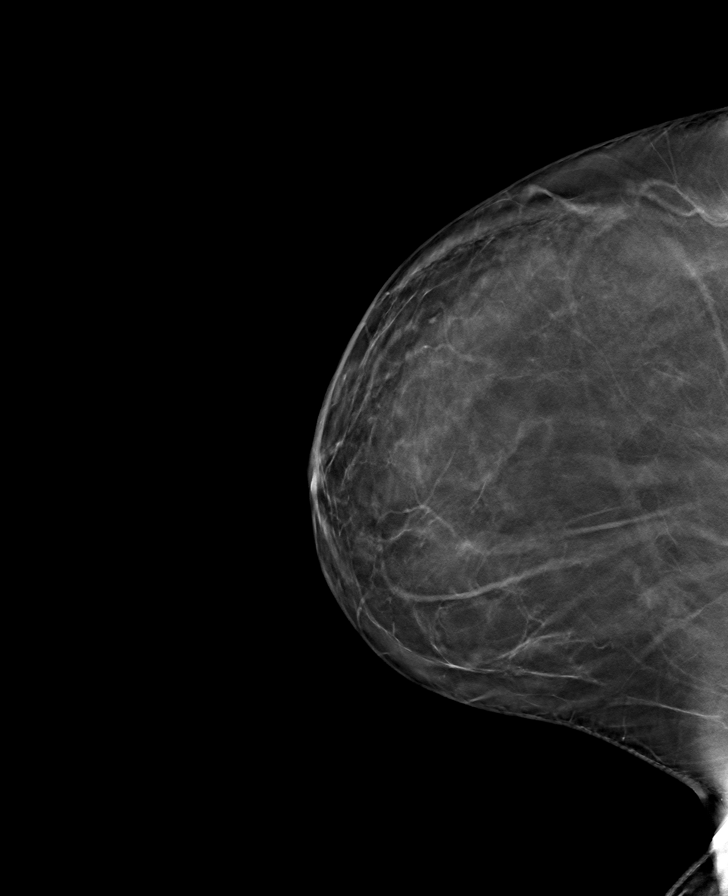

[L CC tomo · tomo slice 42/83.0]
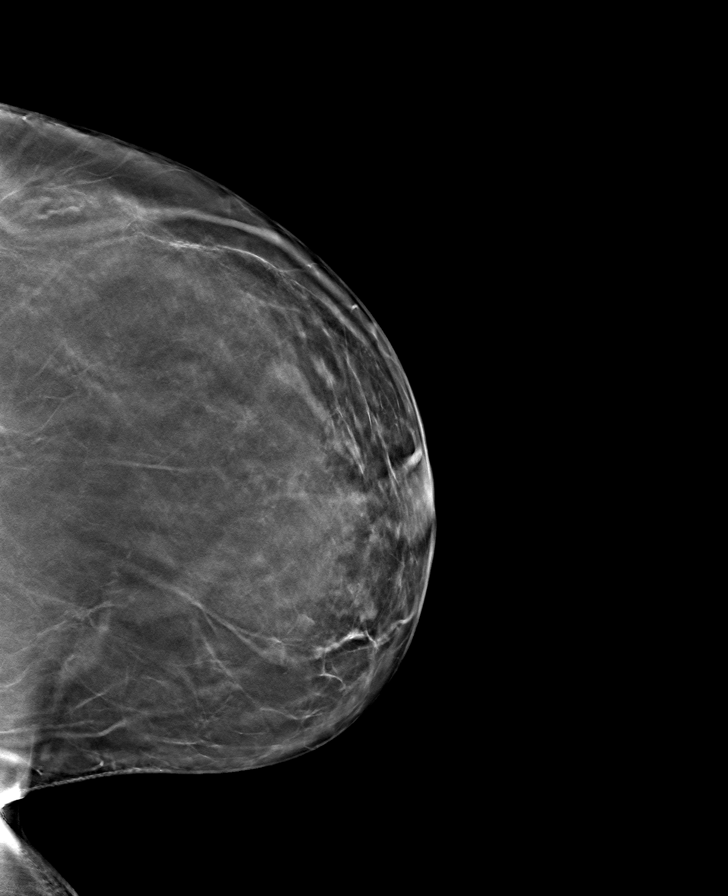

[R MLO tomo · tomo slice 49/96.0]
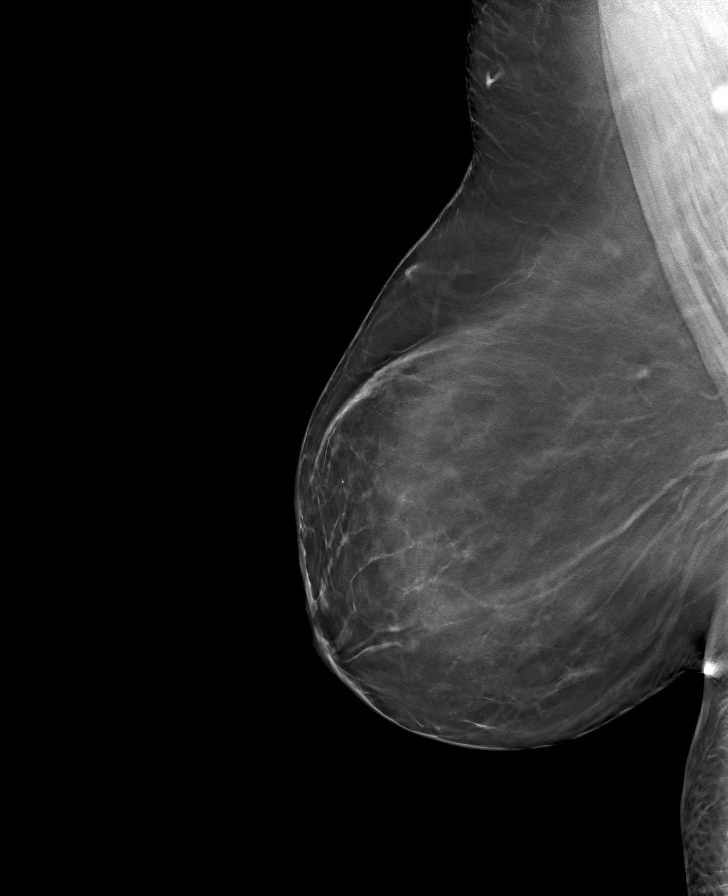

[L MLO tomo · tomo slice 53/105.0]
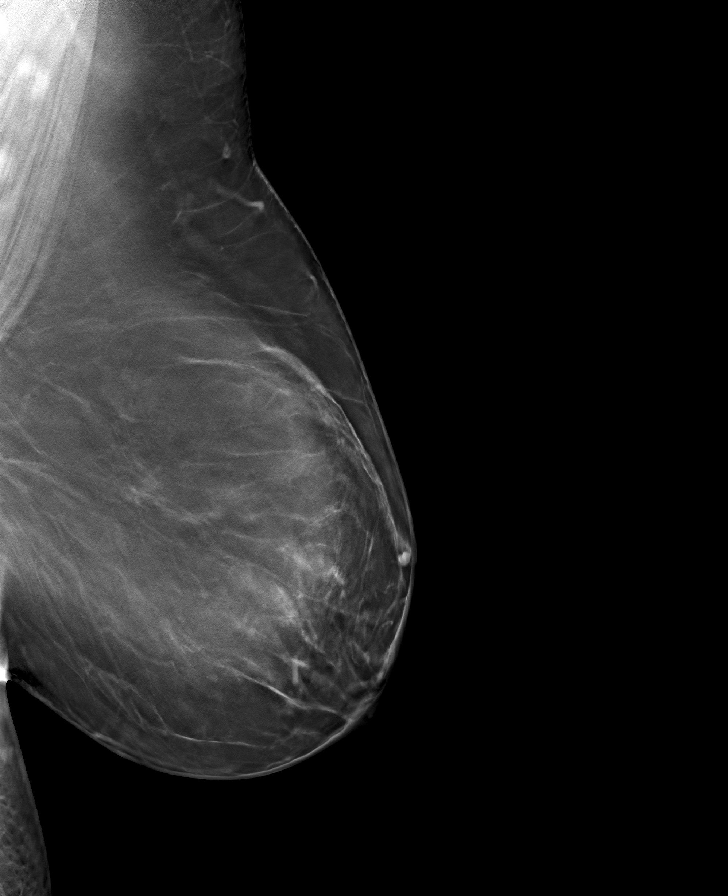

[8 of 24 positions shown; findings below may reference images not displayed]

ACR Breast Density Category b: There are scattered areas of
fibroglandular density.
FINDINGS: There are no findings suspicious for malignancy.
IMPRESSION: No mammographic evidence of malignancy. A result letter of this
screening mammogram will be mailed directly to the patient.

RECOMMENDATION:
Screening mammogram in one year. (Code:51-O-LD2)

BI-RADS CATEGORY  1: Negative.

## 2024-07-09 ENCOUNTER — Ambulatory Visit: Admitting: Urology
